# Patient Record
Sex: Male | Born: 1995 | Hispanic: No | Marital: Single | Smoking: Current every day smoker
Health system: Southern US, Community
[De-identification: ages and names within clinical notes are randomized; demographics above are authoritative.]

## PROBLEM LIST (undated history)

## (undated) DIAGNOSIS — R569 Unspecified convulsions: Secondary | ICD-10-CM

## (undated) DIAGNOSIS — H409 Unspecified glaucoma: Secondary | ICD-10-CM

## (undated) HISTORY — DX: Unspecified convulsions: R56.9

## (undated) HISTORY — DX: Unspecified glaucoma: H40.9

---
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
## 2018-10-15 ENCOUNTER — Other Ambulatory Visit
Admission: RE | Admit: 2018-10-15 | Discharge: 2018-10-15 | Disposition: A | Discharge status: Home / Self Care | Payer: Federal, State, Local not specified - PPO | Source: Ambulatory Visit | Attending: Internal Medicine | Admitting: Internal Medicine

## 2018-10-15 DIAGNOSIS — R1084 Generalized abdominal pain: Secondary | ICD-10-CM | POA: Diagnosis present

## 2018-10-15 LAB — COMPREHENSIVE METABOLIC PANEL
ALT: 12 U/L (ref 0–44)
AST: 21 U/L (ref 15–41)
Albumin: 4.4 g/dL (ref 3.5–5.0)
Alkaline Phosphatase: 35 U/L — ABNORMAL LOW (ref 38–126)
Anion gap: 9 (ref 5–15)
BUN: 6 mg/dL (ref 6–20)
CO2: 27 mmol/L (ref 22–32)
Calcium: 9.1 mg/dL (ref 8.9–10.3)
Chloride: 104 mmol/L (ref 98–111)
Creatinine, Ser: 1.07 mg/dL (ref 0.61–1.24)
GFR calc Af Amer: >60 mL/min (ref >60)
GFR calc non Af Amer: >60 mL/min (ref >60)
Glucose, Bld: 102 mg/dL — ABNORMAL HIGH (ref 70–99)
Potassium: 3.3 mmol/L — ABNORMAL LOW (ref 3.5–5.1)
Sodium: 140 mmol/L (ref 135–145)
Total Bilirubin: 0.6 mg/dL (ref 0.3–1.2)
Total Protein: 7.2 g/dL (ref 6.5–8.1)

## 2018-10-15 LAB — LIPASE, BLOOD: Lipase: 36 U/L (ref 11–51)

## 2018-10-15 LAB — AMYLASE: Amylase: 111 U/L — ABNORMAL HIGH (ref 28–100)

## 2019-02-09 ENCOUNTER — Emergency Department: Payer: Federal, State, Local not specified - PPO

## 2019-02-09 ENCOUNTER — Emergency Department
Admission: EM | Admit: 2019-02-09 | Discharge: 2019-02-09 | Disposition: A | Discharge status: Home / Self Care | Payer: Federal, State, Local not specified - PPO | Attending: Emergency Medicine | Admitting: Emergency Medicine

## 2019-02-09 ENCOUNTER — Other Ambulatory Visit: Payer: Self-pay

## 2019-02-09 DIAGNOSIS — R630 Anorexia: Secondary | ICD-10-CM | POA: Diagnosis not present

## 2019-02-09 DIAGNOSIS — R569 Unspecified convulsions: Secondary | ICD-10-CM | POA: Insufficient documentation

## 2019-02-09 DIAGNOSIS — R519 Headache, unspecified: Secondary | ICD-10-CM | POA: Diagnosis not present

## 2019-02-09 LAB — COMPREHENSIVE METABOLIC PANEL
ALT: 16 U/L (ref 0–44)
AST: 24 U/L (ref 15–41)
Albumin: 4.1 g/dL (ref 3.5–5.0)
Alkaline Phosphatase: 33 U/L — ABNORMAL LOW (ref 38–126)
Anion gap: 8 (ref 5–15)
BUN: 9 mg/dL (ref 6–20)
CO2: 23 mmol/L (ref 22–32)
Calcium: 9 mg/dL (ref 8.9–10.3)
Chloride: 105 mmol/L (ref 98–111)
Creatinine, Ser: 0.95 mg/dL (ref 0.61–1.24)
GFR calc Af Amer: >60 mL/min (ref >60)
GFR calc non Af Amer: >60 mL/min (ref >60)
Glucose, Bld: 95 mg/dL (ref 70–99)
Potassium: 3.8 mmol/L (ref 3.5–5.1)
Sodium: 136 mmol/L (ref 135–145)
Total Bilirubin: 0.5 mg/dL (ref 0.3–1.2)
Total Protein: 7.1 g/dL (ref 6.5–8.1)

## 2019-02-09 LAB — URINALYSIS, COMPLETE (UACMP) WITH MICROSCOPIC
Bacteria, UA: NONE SEEN
Bilirubin Urine: NEGATIVE
Glucose, UA: NEGATIVE mg/dL
Hgb urine dipstick: NEGATIVE
Ketones, ur: NEGATIVE mg/dL
Leukocytes,Ua: NEGATIVE
Nitrite: NEGATIVE
Protein, ur: NEGATIVE mg/dL
Specific Gravity, Urine: 1.017 (ref 1.005–1.030)
pH: 6 (ref 5.0–8.0)

## 2019-02-09 LAB — CBC WITH DIFFERENTIAL/PLATELET
Abs Immature Granulocytes: 0.03 10*3/uL (ref 0.00–0.07)
Basophils Absolute: 0 10*3/uL (ref 0.0–0.1)
Basophils Relative: 1 %
Eosinophils Absolute: 0.2 10*3/uL (ref 0.0–0.5)
Eosinophils Relative: 3 %
HCT: 44.9 % (ref 39.0–52.0)
Hemoglobin: 14.8 g/dL (ref 13.0–17.0)
Immature Granulocytes: 1 %
Lymphocytes Relative: 24 %
Lymphs Abs: 1.3 10*3/uL (ref 0.7–4.0)
MCH: 27.1 pg (ref 26.0–34.0)
MCHC: 33 g/dL (ref 30.0–36.0)
MCV: 82.2 fL (ref 80.0–100.0)
Monocytes Absolute: 0.4 10*3/uL (ref 0.1–1.0)
Monocytes Relative: 7 %
Neutro Abs: 3.5 10*3/uL (ref 1.7–7.7)
Neutrophils Relative %: 64 %
Platelets: 190 10*3/uL (ref 150–400)
RBC: 5.46 MIL/uL (ref 4.22–5.81)
RDW: 13.1 % (ref 11.5–15.5)
WBC: 5.5 10*3/uL (ref 4.0–10.5)
nRBC: 0 % (ref 0.0–0.2)

## 2019-02-09 LAB — URINE DRUG SCREEN, QUALITATIVE (ARMC ONLY)
Amphetamines, Ur Screen: NOT DETECTED
Barbiturates, Ur Screen: NOT DETECTED
Benzodiazepine, Ur Scrn: NOT DETECTED
Cannabinoid 50 Ng, Ur ~~LOC~~: NOT DETECTED
Cocaine Metabolite,Ur ~~LOC~~: NOT DETECTED
MDMA (Ecstasy)Ur Screen: NOT DETECTED
Methadone Scn, Ur: NOT DETECTED
Opiate, Ur Screen: NOT DETECTED
Phencyclidine (PCP) Ur S: NOT DETECTED
Tricyclic, Ur Screen: NOT DETECTED

## 2019-02-09 LAB — CK: Total CK: 155 U/L (ref 49–397)

## 2019-02-09 LAB — MAGNESIUM: Magnesium: 2.2 mg/dL (ref 1.7–2.4)

## 2019-02-09 MED ORDER — SODIUM CHLORIDE 0.9 % IV BOLUS
1000.0000 mL | Freq: Once | INTRAVENOUS | Status: AC
  Administered 2019-02-09: 16:00:00 1000 mL via INTRAVENOUS

## 2019-02-09 MED ORDER — LORAZEPAM 2 MG/ML IJ SOLN
1.0000 mg | Freq: Once | INTRAMUSCULAR | Status: AC
  Administered 2019-02-09: 16:00:00 1 mg via INTRAVENOUS
  Filled 2019-02-09: qty 1

## 2019-02-09 NOTE — ED Triage Notes (Signed)
Pt here from home via EMS. Per EMS pt had a witnessed seizure at 1420 that lasted approximately two minutes and was tonic clonic. Pt was post ictal until Ems arrived. Pt now A&O x4. Pt c/o headache 3/10.   Pt denies hx of the same, also denies taking any substances/ alcohol.   Ems vss- bp 110/60, HR 100, RR 16, CBG 109, spO2 97% RA.

## 2019-02-09 NOTE — ED Notes (Signed)
Pt verbalized d/c instructions with no questions.

## 2019-02-09 NOTE — ED Notes (Signed)
EDP at bedside  

## 2019-02-09 NOTE — ED Notes (Signed)
Pt ambulated to bathroom with steady gait. 

## 2019-02-09 NOTE — Discharge Instructions (Addendum)
You have been seen in the emergency department today for a likely seizure.  Your workup today including labs are within normal limits.  Please follow up with your doctor as soon as possible regarding today's emergency department visit and your likely seizure.  You will also need to follow up with a neurologist as soon as possible, please call for appointment.  If you have been prescribed a medication for your seizures, please take this medication as prescribed.  As we have discussed it is very important that you DO NOT drive until you have been seen and cleared by your neurologist.  Please drink plenty of fluids, get plenty of sleep and avoid any alcohol or drug use.  Return to the emergency department if you have any further seizures, develop any weakness/numbness of any arm/leg, confusion, slurred speech, or sudden/severe headache.  

## 2019-02-09 NOTE — ED Provider Notes (Signed)
[facility] Emergency Department Provider Note  ____________________________________________   First MD Initiated Contact with Patient 02/09/19 1541     (approximate)  I have reviewed the triage vital signs and the nursing notes.   HISTORY  Chief Complaint Seizures    HPI Paul Gardner is a 23 y.o. male  With h/o glaucoma here with seizure. Pt reportedly was with his GF earlier today when he had a witnessed GTC seizure. He reportedly twitched toward her then she checked on him, and his eyes were rolled back and his entire body stiffened. This lasted 1-2 min. He then was drowsy and minimally responsive with sonorous respirations. He would not wake up. This lasted 20-30 min until he returned to his baseline. No h/o seizures. Of note, pt has reportedly had decreased appetite and stomach issues for months. He has seen a PCP for this w/o relief. He has had some intermittent HA as well, though not consistent and not worse in AM. No vision changes. No focal numbness or weakness. He has a mild HA now btu o/w is without complaints. He is adamant he does not use drugs or drink alcohol regularly. No other issues.        Past Medical History:  Diagnosis Date  . Glaucoma    left eye    Patient Active Problem List   Diagnosis Date Noted  . 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 SOB (shortness of breath) on exertion 02/04/2015  . Skin lesion 02/04/2015  . Poor concentration 04/02/2014  . Glaucoma 04/02/2014      Prior to Admission medications   Medication Sig Start Date End Date Taking? Authorizing Provider  mycophenolate (CELLCEPT) 500 MG tablet Take 1 Tablet in the morning and 2 tablets in the evening 1 hour before meals or 2 hours after meals. Taken by mouth as directed 01/04/15   [provider]    Allergies Patient has no known allergies.  Family History  Problem Relation Age of Onset  . Mental illness Mother   . Arthritis Maternal Aunt   .  Arthritis Maternal Grandmother   . Alcohol abuse Maternal Grandfather   . Arthritis Paternal Grandmother   . Hypertension Paternal Grandfather   . Hyperlipidemia Paternal Grandfather     Social History Social History   Tobacco Use  . Smoking status: Never Smoker  . Smokeless tobacco: Never Used  Substance Use Topics  . Alcohol use: No    Alcohol/week: 0.0 standard drinks  . Drug use: No    Review of Systems  Review of Systems  Constitutional: Positive for fatigue. Negative for chills and fever.  HENT: Negative for sore throat.   Respiratory: Negative for shortness of breath.   Cardiovascular: Negative for chest pain.  Gastrointestinal: Positive for nausea and vomiting. Negative for abdominal pain.  Genitourinary: Negative for flank pain.  Musculoskeletal: Negative for neck pain.  Skin: Negative for rash and wound.  Allergic/Immunologic: Negative for immunocompromised state.  Neurological: Positive for seizures. Negative for weakness and numbness.  Hematological: Does not bruise/bleed easily.  All other systems reviewed and are negative.    ____________________________________________  PHYSICAL EXAM:      VITAL SIGNS: ED Triage Vitals  Enc Vitals Group     BP      Pulse      Resp      Temp      Temp src      SpO2      Weight      Height  Head Circumference      Peak Flow      Pain Score      Pain Loc      Pain Edu?      Excl. in GC?      Physical Exam Vitals signs and nursing note reviewed.  Constitutional:      General: He is not in acute distress.    Appearance: He is well-developed.  HENT:     Head: Normocephalic and atraumatic.  Eyes:     Conjunctiva/sclera: Conjunctivae normal.  Neck:     Musculoskeletal: Neck supple.  Cardiovascular:     Rate and Rhythm: Normal rate and regular rhythm.     Heart sounds: Normal heart sounds. No murmur. No friction rub.  Pulmonary:     Effort: Pulmonary effort is normal. No respiratory distress.      Breath sounds: Normal breath sounds. No wheezing or rales.  Abdominal:     General: There is no distension.     Palpations: Abdomen is soft.     Tenderness: There is no abdominal tenderness.  Skin:    General: Skin is warm.     Capillary Refill: Capillary refill takes less than 2 seconds.  Neurological:     Mental Status: He is alert and oriented to person, place, and time.     Motor: No abnormal muscle tone.       ____________________________________________   LABS (all labs ordered are listed, but only abnormal results are displayed)  Labs Reviewed  COMPREHENSIVE METABOLIC PANEL - Abnormal; Notable for the following components:      Result Value   Alkaline Phosphatase 33 (*)    All other components within normal limits  URINALYSIS, COMPLETE (UACMP) WITH MICROSCOPIC - Abnormal; Notable for the following components:   Color, Urine YELLOW (*)    APPearance HAZY (*)    All other components within normal limits  CBC WITH DIFFERENTIAL/PLATELET  MAGNESIUM  CK  URINE DRUG SCREEN, QUALITATIVE ([facility] ONLY)    ____________________________________________  EKG: Normal sinus rhythm, VR 77. PR 120, QRS 88, QTc 416. No St elevatiosn or depressions. TWI in lead III noted. No WPW, Brugada, or other arrhythmogenic abnormality noted. ________________________________________  RADIOLOGY All imaging, including plain films, CT scans, and ultrasounds, independently reviewed by me, and interpretations confirmed via formal radiology reads.  ED MD interpretation:   CT Head: NAICA  Official radiology report(s): Ct Head Wo Contrast  Result Date: 02/09/2019 CLINICAL DATA:  New onset seizure.  Headache. EXAM: CT HEAD WITHOUT CONTRAST TECHNIQUE: Contiguous axial images were obtained from the base of the skull through the vertex without intravenous contrast. COMPARISON:  None. FINDINGS: Brain: No evidence of acute infarction, hemorrhage, hydrocephalus, extra-axial collection or mass lesion/mass  effect. Vascular: Negative for hyperdense vessel Skull: Negative Sinuses/Orbits: Negative Other: None IMPRESSION: Negative CT head Electronically Signed   By: Marlan Palauharles  Clark M.D.   On: 02/09/2019 16:24    ____________________________________________  PROCEDURES   Procedure(s) performed (including Critical Care):  Procedures  ____________________________________________  INITIAL IMPRESSION / MDM / ASSESSMENT AND PLAN / ED COURSE  As part of my medical decision making, I reviewed the following data within the electronic MEDICAL RECORD NUMBER Nursing notes reviewed and incorporated, Old chart reviewed, Notes from prior ED visits, and [place] Controlled Substance Database       *Paul Gardner was evaluated in Emergency Department on 02/10/2019 for the symptoms described in the history of present illness. He was evaluated in the context of the global COVID-19 pandemic,  which necessitated consideration that the patient might be at risk for infection with the SARS-CoV-2 virus that causes COVID-19. Institutional protocols and algorithms that pertain to the evaluation of patients at risk for COVID-19 are in a state of rapid change based on information released by regulatory bodies including the CDC and federal and state organizations. These policies and algorithms were followed during the patient's care in the ED.  Some ED evaluations and interventions may be delayed as a result of limited staffing during the pandemic.*  Clinical Course as of Feb 09 237  Mon Feb 09, 2019  1613 23 yo M here with new onset first-time seizure. No focal neurological deficits.   [CI]    Clinical Course User Index [CI] Shaune Pollack, MD    Medical Decision Making:  23 yo M here with new onset seizures. CT head neg. No focal neurological deficits and he is back to baseline. No fever, chills, or infectious sx to suggest meningitis, encephalitis. No focal deficits to suggest CVA, mass/lesion. No personal or family h/o  epilepsy. Denies drug use. Labs reassuring. EKG non-ischemic with no arrhythmogenic abnormality to suggest convulsive syncope from cardiac cause. Given single seizure w return to baseline and reassuring labs and imaging, will refer to Neuro as outpt. Driving precautions discussed.  ____________________________________________  FINAL CLINICAL IMPRESSION(S) / ED DIAGNOSES  Final diagnoses:  First time seizure [facility])     MEDICATIONS GIVEN DURING THIS VISIT:  Medications  sodium chloride 0.9 % bolus 1,000 mL (0 mLs Intravenous Stopped 02/09/19 1739)  LORazepam (ATIVAN) injection 1 mg (1 mg Intravenous Given 02/09/19 1610)     ED Discharge Orders         Ordered    Ambulatory referral to Neurology    Comments: An appointment is requested in approximately: 2 weeks   02/09/19 1723           Note:  This document was prepared using Dragon voice recognition software and may include unintentional dictation errors.   Shaune Pollack, MD 02/10/19 [phone]

## 2019-02-09 NOTE — ED Notes (Signed)
Family at bedside. 

## 2019-02-10 ENCOUNTER — Encounter: Payer: Self-pay | Admitting: Neurology

## 2019-04-14 ENCOUNTER — Encounter: Payer: Self-pay | Admitting: Neurology

## 2019-04-14 ENCOUNTER — Other Ambulatory Visit: Payer: Self-pay

## 2019-04-14 ENCOUNTER — Ambulatory Visit: Payer: Federal, State, Local not specified - PPO | Admitting: Neurology

## 2019-04-14 VITALS — BP 113/61 | HR 63 | Ht 70.0 in | Wt 156.2 lb

## 2019-04-14 DIAGNOSIS — R569 Unspecified convulsions: Secondary | ICD-10-CM | POA: Diagnosis not present

## 2019-04-14 NOTE — Patient Instructions (Addendum)
1. Schedule MRI brain with and without contrast [facility] [phone]  2. Schedule 1-hour sleep-deprived EEG  3. Follow-up in 3 months, call for any changes  Seizure Precautions: 1. If medication has been prescribed for you to prevent seizures, take it exactly as directed.  Do not stop taking the medicine without talking to your doctor first, even if you have not had a seizure in a long time.   2. Avoid activities in which a seizure would cause danger to yourself or to others.  Don't operate dangerous machinery, swim alone, or climb in high or dangerous places, such as on ladders, roofs, or girders.  Do not drive unless your doctor says you may.  3. If you have any warning that you may have a seizure, lay down in a safe place where you can't hurt yourself.    4.  No driving for 6 months from last seizure, as per [facility].   Please refer to the following link on the Epilepsy Foundation of America's website for more information: http://www.epilepsyfoundation.org/answerplace/Social/driving/drivingu.cfm   5.  Maintain good sleep hygiene. Avoid alcohol.  6.  Contact your doctor if you have any problems that may be related to the medicine you are taking.  7.  Call 911 and bring the patient back to the ED if:        A.  The seizure lasts longer than 5 minutes.       B.  The patient doesn't awaken shortly after the seizure  C.  The patient has new problems such as difficulty seeing, speaking or moving  D.  The patient was injured during the seizure  E.  The patient has a temperature over 102 F (39C)  F.  The patient vomited and now is having trouble breathing

## 2019-04-14 NOTE — Progress Notes (Signed)
NEUROLOGY CONSULTATION NOTE  Paul Gardner MRN: [id number] DOB: 09-16-1995  Referring provider: Dr. Duffy Bruce Primary care provider: Dr. Loura Pardon  Reason for consult:  New onset seizure  Dear Dr Ellender Hose:  Thank you for your kind referral of Paul Gardner for consultation of the above symptoms. Although his history is well known to you, please allow me to reiterate it for the purpose of our medical record. The patient was accompanied to the clinic by his father who also provides collateral information. Records and images were personally reviewed where available.  HISTORY OF PRESENT ILLNESS: Paul Gardner is a 24 year old right-handed man with a history of glaucoma, in his usual state of health until he had a nocturnal seizure on 02/09/2019. He works third shift at [employer] and after getting home from work, he recalls eating and feeling dehydrated, then waking up to EMS around him. He was asleep and his girlfriend noticed twitching, eyes rolled back, and entire body stiffened for 1-2 minutes. He was drowsy and minimally responsive when his father came upstairs, with sonorous respirations. This lasted 20-30 minutes until he returned to baseline. No tongue bite, incontinence, focal weakness. It was noted in the ER that he had decreased appetite and stomach issues for months. He also reported intermittent headaches. In the ER, CBC, CMP, UDS were negative. I personally reviewed head CT without contrast which did not show any acute changes. He has been feeling fine since then, with no further seizures. He and his father deny any staring/unresponsive episodes, gaps in time, olfactory/gustatory hallucinations, rising epigastric sensation (except for GERD-like symptoms), focal numbness/tingling/weakness. He has occasional twitching. He denies any significant headaches. He denies any dizziness, diplopia, dysarthria/dysphagia, neck pain, bowel/bladder dysfunction. He has some back pain from work. He denies  any alcohol or drug use. He has had some sleep difficulties due to third shift at work.  Epilepsy Risk Factors:  He had a normal birth and early development.  There is no history of febrile convulsions, CNS infections such as meningitis/encephalitis, significant traumatic brain injury, neurosurgical procedures, or family history of seizures.   PAST MEDICAL HISTORY: Past Medical History:  Diagnosis Date  . Glaucoma    left eye    PAST SURGICAL HISTORY: Past Surgical History:  Procedure Laterality Date  . GONIOTOMY  01/2008    MEDICATIONS: No current outpatient medications on file prior to visit.   No current facility-administered medications on file prior to visit.    ALLERGIES: No Known Allergies  FAMILY HISTORY: Family History  Problem Relation Age of Onset  . Mental illness Mother   . Arthritis Maternal Aunt   . Arthritis Maternal Grandmother   . Alcohol abuse Maternal Grandfather   . Arthritis Paternal Grandmother   . Hypertension Paternal Grandfather   . Hyperlipidemia Paternal Grandfather     SOCIAL HISTORY: Social History   Socioeconomic History  . Marital status: Single    Spouse name: Not on file  . Number of children: Not on file  . Years of education: Not on file  . Highest education level: Not on file  Occupational History  . Not on file  Tobacco Use  . Smoking status: Current Every Day Smoker    Types: E-cigarettes  . Smokeless tobacco: Never Used  Substance and Sexual Activity  . Alcohol use: No    Alcohol/week: 0.0 standard drinks  . Drug use: No  . Sexual activity: Not on file  Other Topics Concern  . Not on file  Social History Narrative  Right handed   Lives with family   2 story home    Social Determinants of Health   Financial Resource Strain:   . Difficulty of Paying Living Expenses: Not on file  Food Insecurity:   . Worried About Programme researcher, broadcasting/film/video in the Last Year: Not on file  . Ran Out of Food in the Last Year: Not on  file  Transportation Needs:   . Lack of Transportation (Medical): Not on file  . Lack of Transportation (Non-Medical): Not on file  Physical Activity:   . Days of Exercise per Week: Not on file  . Minutes of Exercise per Session: Not on file  Stress:   . Feeling of Stress : Not on file  Social Connections:   . Frequency of Communication with Friends and Family: Not on file  . Frequency of Social Gatherings with Friends and Family: Not on file  . Attends Religious Services: Not on file  . Active Member of Clubs or Organizations: Not on file  . Attends Banker Meetings: Not on file  . Marital Status: Not on file  Intimate Partner Violence:   . Fear of Current or Ex-Partner: Not on file  . Emotionally Abused: Not on file  . Physically Abused: Not on file  . Sexually Abused: Not on file    REVIEW OF SYSTEMS: Constitutional: No fevers, chills, or sweats, no generalized fatigue, change in appetite Eyes: No visual changes, double vision, eye pain Ear, nose and throat: No hearing loss, ear pain, nasal congestion, sore throat Cardiovascular: No chest pain, palpitations Respiratory:  No shortness of breath at rest or with exertion, wheezes GastrointestinaI: No nausea, vomiting, diarrhea, abdominal pain, fecal incontinence Genitourinary:  No dysuria, urinary retention or frequency Musculoskeletal:  No neck pain, +back pain Integumentary: No rash, pruritus, skin lesions Neurological: as above Psychiatric: No depression, insomnia, anxiety Endocrine: No palpitations, fatigue, diaphoresis, mood swings, change in appetite, change in weight, increased thirst Hematologic/Lymphatic:  No anemia, purpura, petechiae. Allergic/Immunologic: no itchy/runny eyes, nasal congestion, recent allergic reactions, rashes  PHYSICAL EXAM: Vitals:   04/14/19 0832  BP: 113/61  Pulse: 63  SpO2: 99%   General: No acute distress Head:  Normocephalic/atraumatic Skin/Extremities: No rash, no  edema Neurological Exam: Mental status: alert and oriented to person, place, and time, no dysarthria or aphasia, Fund of knowledge is appropriate.  Recent and remote memory are intact. 3/3 delayed recall. Attention and concentration are normal.    Able to name objects and repeat phrases. Cranial nerves: CN I: not tested CN II: pupils equal, round and reactive to light, visual fields intact CN III, IV, VI:  full range of motion, no nystagmus, no ptosis CN V: facial sensation intact CN VII: upper and lower face symmetric CN VIII: hearing intact to conversation CN IX, X: gag intact, uvula midline CN XI: sternocleidomastoid and trapezius muscles intact CN XII: tongue midline Bulk & Tone: normal, no fasciculations. Motor: 5/5 throughout with no pronator drift. Sensation: intact to light touch, cold, pin, vibration and joint position sense.  No extinction to double simultaneous stimulation.  Romberg test negative Deep Tendon Reflexes: +2 throughout, no ankle clonus Plantar responses: downgoing bilaterally Cerebellar: no incoordination on finger to nose testing Gait: narrow-based and steady, able to tandem walk adequately. Tremor: none  IMPRESSION: This is a 24 year old right-handed man with a history of glaucoma, presenting for evaluation of new onset nocturnal seizure last 02/09/2019. His neurological exam is normal, no clear epilepsy risk factors. We  discussed that after an initial seizure, unless there are significant risk factors, an abnormal neurological exam, an EEG showing epileptiform abnormalities, and/or abnormal neuroimaging, treatment with an antiepileptic drug is not indicated. MRI brain with and without contrast and a 1-hour sleep-deprived EEG will be ordered to assess for focal abnormalities that increase risk for recurrent seizure. We discussed 10% of the population may have a single seizure. Patients with a single unprovoked seizure have a recurrence rate of 33% after a single  seizure and 73% after a second seizure. We discussed [place] driving restrictions which indicate a patient needs to free of seizures or events of altered awareness for 6 months prior to resuming driving. The patient agreed to comply with these restrictions. We discussed avoidance of seizure triggers, including alcohol and sleep deprivation. He works third shift, I discussed working first/second shift, which he states is not possible at this time. We discussed trying melatonin to help with shift work sleep disorder. Follow-up in 3 months, they know to call for any changes.   Thank you for allowing me to participate in the care of this patient. Please do not hesitate to call for any questions or concerns.   Patrcia Dolly, M.D.  CC: Dr. Erma Heritage, Dr. Milinda Antis

## 2019-04-22 ENCOUNTER — Other Ambulatory Visit: Payer: Federal, State, Local not specified - PPO

## 2019-04-30 ENCOUNTER — Ambulatory Visit (INDEPENDENT_AMBULATORY_CARE_PROVIDER_SITE_OTHER): Payer: Federal, State, Local not specified - PPO | Admitting: Neurology

## 2019-04-30 ENCOUNTER — Other Ambulatory Visit: Payer: Self-pay

## 2019-04-30 DIAGNOSIS — R569 Unspecified convulsions: Secondary | ICD-10-CM | POA: Diagnosis not present

## 2019-05-05 NOTE — Procedures (Signed)
ELECTROENCEPHALOGRAM REPORT  Date of Study: 04/30/2019  Patient's Name: Paul Gardner MRN: [id number] Date of Birth: 12-22-1995  Referring Provider: Dr. Patrcia Dolly  Clinical History: This is a 24 year old man with new onset nocturnal seizure in December 2020.   Medications: none  Technical Summary: A multichannel digital 1-hour sleep-deprived EEG recording measured by the international 10-20 system with electrodes applied with paste and impedances below 5000 ohms performed in our laboratory with EKG monitoring in an awake and asleep patient.  Hyperventilation was not performed. Photic stimulation was performed.  The digital EEG was referentially recorded, reformatted, and digitally filtered in a variety of bipolar and referential montages for optimal display.    Description: The patient is awake and asleep during the recording.  During maximal wakefulness, there is a symmetric, medium voltage 10 Hz posterior dominant rhythm that attenuates with eye opening.  The record is symmetric.  During drowsiness and sleep, there is an increase in theta slowing of the background, with shifting asymmetry over the bilateral temporal regions.  Vertex waves and symmetric sleep spindles were seen.  Hyperventilation and photic stimulation did not elicit any abnormalities.  There were no epileptiform discharges or electrographic seizures seen.    EKG lead was unremarkable.  Impression: This 1-hour awake and asleep EEG is within normal limits.  Clinical Correlation: A normal EEG does not exclude a clinical diagnosis of epilepsy.  If further clinical questions remain, prolonged EEG may be helpful.  Clinical correlation is advised.   Patrcia Dolly, M.D.

## 2019-05-14 ENCOUNTER — Ambulatory Visit
Admission: RE | Admit: 2019-05-14 | Discharge: 2019-05-14 | Disposition: A | Discharge status: Home / Self Care | Payer: Federal, State, Local not specified - PPO | Source: Ambulatory Visit | Attending: Neurology | Admitting: Neurology

## 2019-05-14 ENCOUNTER — Other Ambulatory Visit: Payer: Self-pay

## 2019-05-14 DIAGNOSIS — R569 Unspecified convulsions: Secondary | ICD-10-CM

## 2019-05-14 MED ORDER — GADOBENATE DIMEGLUMINE 529 MG/ML IV SOLN
14.0000 mL | Freq: Once | INTRAVENOUS | Status: AC | PRN
  Administered 2019-05-14: 11:00:00 14 mL via INTRAVENOUS

## 2019-05-20 ENCOUNTER — Telehealth: Payer: Self-pay | Admitting: Neurology

## 2019-05-20 NOTE — Telephone Encounter (Signed)
Pls let father know the MRI brain was normal, no evidence of tumor, stroke, or bleed. The EEG was normal. It is not like a pregnancy test that is positive or negative, it is just a snapshot of his brain waves. As we discussed, with normal tests after 1 seizure, we don't usually start a seizure medication. Some people can have one seizure and nothing else, however the reason the DMV gave the 6 month driving restriction is that usually if someone will have another seizure, it is within the first 6 months, and if he does have another seizure, we will need to start medication at that point. Call for any changes.

## 2019-05-20 NOTE — Telephone Encounter (Signed)
Patient's father called and requested this patient's recent MRI and EEG results.

## 2019-05-20 NOTE — Telephone Encounter (Signed)
Spoke with pt informed him that    MRI brain was normal, no evidence of tumor, stroke, or bleed. The EEG was normal. It is not like a pregnancy test that is positive or negative, it is just a snapshot of his brain waves. As we discussed, with normal tests after 1 seizure, we don't usually start a seizure medication. Some people can have one seizure and nothing else, however the reason the DMV gave the 6 month driving restriction is that usually if someone will have another seizure, it is within the first 6 months, and if he does have another seizure, we will need to start medication at that point. Call for any changes.

## 2019-07-24 ENCOUNTER — Encounter: Payer: Self-pay | Admitting: Neurology

## 2019-07-24 ENCOUNTER — Ambulatory Visit: Payer: Federal, State, Local not specified - PPO | Admitting: Neurology

## 2019-07-24 ENCOUNTER — Other Ambulatory Visit: Payer: Self-pay

## 2019-07-24 VITALS — BP 112/70 | HR 86 | Resp 20 | Ht 70.0 in | Wt 146.0 lb

## 2019-07-24 DIAGNOSIS — R569 Unspecified convulsions: Secondary | ICD-10-CM | POA: Diagnosis not present

## 2019-07-24 NOTE — Patient Instructions (Signed)
Great to see you. Follow-up in 6 months, call for any changes.  Seizure Precautions: 1. If medication has been prescribed for you to prevent seizures, take it exactly as directed.  Do not stop taking the medicine without talking to your doctor first, even if you have not had a seizure in a long time.   2. Avoid activities in which a seizure would cause danger to yourself or to others.  Don't operate dangerous machinery, swim alone, or climb in high or dangerous places, such as on ladders, roofs, or girders.  Do not drive unless your doctor says you may.  3. If you have any warning that you may have a seizure, lay down in a safe place where you can't hurt yourself.    4.  No driving for 6 months from last seizure, as per [facility].   Please refer to the following link on the Epilepsy Foundation of [employer]'s website for more information: http://www.epilepsyfoundation.org/answerplace/Social/driving/drivingu.cfm   5.  Maintain good sleep hygiene. Avoid alcohol.  6.  Contact your doctor if you have any problems that may be related to the medicine you are taking.  7.  Call 911 and bring the patient back to the ED if:        A.  The seizure lasts longer than 5 minutes.       B.  The patient doesn't awaken shortly after the seizure  C.  The patient has new problems such as difficulty seeing, speaking or moving  D.  The patient was injured during the seizure  E.  The patient has a temperature over 102 F (39C)  F.  The patient vomited and now is having trouble breathing

## 2019-07-24 NOTE — Progress Notes (Signed)
NEUROLOGY FOLLOW UP OFFICE NOTE  Gerber Penza [id number] 1996-03-04  HISTORY OF PRESENT ILLNESS: I had the pleasure of seeing Paul Gardner in follow-up in the neurology clinic on 07/24/2019.  The patient was last seen 3 months ago for new onset nocturnal seizure that occurred last 02/09/2019. He is again accompanied by his father who helps supplement the history today. He and his father deny any seizures or seizure-like symptoms since 02/2019. Records and images were personally reviewed where available.  I personally reviewed MRI brain with and without contrast which was normal, hippocampi symmetric with no abnormal signal or enhancement seen. His 1-hour wake and sleep EEG was within normal limits. He denies any staring/unresponsive episodes, gaps in time, olfactory/gustatory hallucinations, focal numbness/tingling/weakness, myoclonic jerks. No significant headaches, dizziness, no falls. Sleep is good.   History on Initial Assessment 04/14/2019: Paul Gardner is a 24 year old right-handed man with a history of glaucoma, in his usual state of health until he had a nocturnal seizure on 02/09/2019. He works third shift at [employer] and after getting home from work, he recalls eating and feeling dehydrated, then waking up to EMS around him. He was asleep and his girlfriend noticed twitching, eyes rolled back, and entire body stiffened for 1-2 minutes. He was drowsy and minimally responsive when his father came upstairs, with sonorous respirations. This lasted 20-30 minutes until he returned to baseline. No tongue bite, incontinence, focal weakness. It was noted in the ER that he had decreased appetite and stomach issues for months. He also reported intermittent headaches. In the ER, CBC, CMP, UDS were negative. I personally reviewed head CT without contrast which did not show any acute changes. He has been feeling fine since then, with no further seizures. He and his father deny any staring/unresponsive episodes,  gaps in time, olfactory/gustatory hallucinations, rising epigastric sensation (except for GERD-like symptoms), focal numbness/tingling/weakness. He has occasional twitching. He denies any significant headaches. He denies any dizziness, diplopia, dysarthria/dysphagia, neck pain, bowel/bladder dysfunction. He has some back pain from work. He denies any alcohol or drug use. He has had some sleep difficulties due to third shift at work.  Epilepsy Risk Factors:  He had a normal birth and early development.  There is no history of febrile convulsions, CNS infections such as meningitis/encephalitis, significant traumatic brain injury, neurosurgical procedures, or family history of seizures.   PAST MEDICAL HISTORY: Past Medical History:  Diagnosis Date  . Glaucoma    left eye    MEDICATIONS: No current outpatient medications on file prior to visit.   No current facility-administered medications on file prior to visit.    ALLERGIES: No Known Allergies  FAMILY HISTORY: Family History  Problem Relation Age of Onset  . Mental illness Mother   . Arthritis Maternal Aunt   . Arthritis Maternal Grandmother   . Alcohol abuse Maternal Grandfather   . Arthritis Paternal Grandmother   . Hypertension Paternal Grandfather   . Hyperlipidemia Paternal Grandfather     SOCIAL HISTORY: Social History   Socioeconomic History  . Marital status: Single    Spouse name: Not on file  . Number of children: Not on file  . Years of education: Not on file  . Highest education level: Not on file  Occupational History  . Not on file  Tobacco Use  . Smoking status: Current Every Day Smoker    Types: E-cigarettes  . Smokeless tobacco: Never Used  Substance and Sexual Activity  . Alcohol use: No    Alcohol/week: 0.0  standard drinks  . Drug use: No  . Sexual activity: Not on file  Other Topics Concern  . Not on file  Social History Narrative   Right handed   Lives with family   2 story home     Social Determinants of Health   Financial Resource Strain:   . Difficulty of Paying Living Expenses:   Food Insecurity:   . Worried About Programme researcher, broadcasting/film/video in the Last Year:   . Barista in the Last Year:   Transportation Needs:   . Freight forwarder (Medical):   Marland Kitchen Lack of Transportation (Non-Medical):   Physical Activity:   . Days of Exercise per Week:   . Minutes of Exercise per Session:   Stress:   . Feeling of Stress :   Social Connections:   . Frequency of Communication with Friends and Family:   . Frequency of Social Gatherings with Friends and Family:   . Attends Religious Services:   . Active Member of Clubs or Organizations:   . Attends Banker Meetings:   Marland Kitchen Marital Status:   Intimate Partner Violence:   . Fear of Current or Ex-Partner:   . Emotionally Abused:   Marland Kitchen Physically Abused:   . Sexually Abused:     REVIEW OF SYSTEMS: Constitutional: No fevers, chills, or sweats, no generalized fatigue, change in appetite Eyes: No visual changes, double vision, eye pain Ear, nose and throat: No hearing loss, ear pain, nasal congestion, sore throat Cardiovascular: No chest pain, palpitations Respiratory:  No shortness of breath at rest or with exertion, wheezes GastrointestinaI: No nausea, vomiting, diarrhea, abdominal pain, fecal incontinence Genitourinary:  No dysuria, urinary retention or frequency Musculoskeletal:  No neck pain, back pain Integumentary: No rash, pruritus, skin lesions Neurological: as above Psychiatric: No depression, insomnia, anxiety Endocrine: No palpitations, fatigue, diaphoresis, mood swings, change in appetite, change in weight, increased thirst Hematologic/Lymphatic:  No anemia, purpura, petechiae. Allergic/Immunologic: no itchy/runny eyes, nasal congestion, recent allergic reactions, rashes  PHYSICAL EXAM: Vitals:   07/24/19 1409  BP: 112/70  Pulse: 86  Resp: 20  SpO2: 99%   General: No acute  distress Head:  Normocephalic/atraumatic Skin/Extremities: No rash, no edema Neurological Exam: alert and oriented to person, place, and time. No aphasia or dysarthria. Fund of knowledge is appropriate.  Recent and remote memory are intact.  Attention and concentration are normal.   Cranial nerves: Pupils equal, round, reactive to light.Extraocular movements intact with no nystagmus. Visual fields full.  No facial asymmetry.  Motor: Bulk and tone normal, muscle strength 5/5 throughout with no pronator drift.   Finger to nose testing intact.  Gait narrow-based and steady, able to tandem walk adequately.    IMPRESSION: This is a 24 yo RH man with a history of glaucoma, with new onset nocturnal seizure last 02/09/2019. Neurological exam normal, MRI and EEG normal. We again discussed that after an initial seizure, unless there are significant risk factors, an abnormal neurological exam, an EEG showing epileptiform abnormalities, and/or abnormal neuroimaging, treatment with an antiepileptic drug is not indicated. He and his father deny any seizures or seizure-like symptoms since 02/2019. We discussed avoidance of seizure triggers. We again discussed [place] driving laws to stop driving until 6 months seizure-free. The patient was instructed to call with recurrent events, as in that case treatment and further diagnostic workup may be indicated. Follow-up in 6 months.  Thank you for allowing me to participate in his care.  Please do not  hesitate to call for any questions or concerns.   Patrcia Dolly, M.D.   CC: Dr. Milinda Antis

## 2019-11-01 ENCOUNTER — Emergency Department
Admission: EM | Admit: 2019-11-01 | Discharge: 2019-11-02 | Disposition: A | Discharge status: Home / Self Care | Payer: Federal, State, Local not specified - PPO | Attending: Emergency Medicine | Admitting: Emergency Medicine

## 2019-11-01 ENCOUNTER — Other Ambulatory Visit: Payer: Self-pay

## 2019-11-01 ENCOUNTER — Emergency Department: Payer: Federal, State, Local not specified - PPO

## 2019-11-01 DIAGNOSIS — F17210 Nicotine dependence, cigarettes, uncomplicated: Secondary | ICD-10-CM | POA: Diagnosis not present

## 2019-11-01 DIAGNOSIS — R569 Unspecified convulsions: Secondary | ICD-10-CM | POA: Insufficient documentation

## 2019-11-01 DIAGNOSIS — Z20822 Contact with and (suspected) exposure to covid-19: Secondary | ICD-10-CM | POA: Diagnosis not present

## 2019-11-01 LAB — COMPREHENSIVE METABOLIC PANEL
ALT: 13 U/L (ref 0–44)
AST: 28 U/L (ref 15–41)
Albumin: 4.9 g/dL (ref 3.5–5.0)
Alkaline Phosphatase: 33 U/L — ABNORMAL LOW (ref 38–126)
Anion gap: 12 (ref 5–15)
BUN: 12 mg/dL (ref 6–20)
CO2: 24 mmol/L (ref 22–32)
Calcium: 9.1 mg/dL (ref 8.9–10.3)
Chloride: 100 mmol/L (ref 98–111)
Creatinine, Ser: 1.22 mg/dL (ref 0.61–1.24)
GFR calc Af Amer: >60 mL/min (ref >60)
GFR calc non Af Amer: >60 mL/min (ref >60)
Glucose, Bld: 83 mg/dL (ref 70–99)
Potassium: 4.3 mmol/L (ref 3.5–5.1)
Sodium: 136 mmol/L (ref 135–145)
Total Bilirubin: 1 mg/dL (ref 0.3–1.2)
Total Protein: 8 g/dL (ref 6.5–8.1)

## 2019-11-01 LAB — CBC
HCT: 45.9 % (ref 39.0–52.0)
Hemoglobin: 15.4 g/dL (ref 13.0–17.0)
MCH: 27.6 pg (ref 26.0–34.0)
MCHC: 33.6 g/dL (ref 30.0–36.0)
MCV: 82.4 fL (ref 80.0–100.0)
Platelets: 211 10*3/uL (ref 150–400)
RBC: 5.57 MIL/uL (ref 4.22–5.81)
RDW: 12.3 % (ref 11.5–15.5)
WBC: 10.6 10*3/uL — ABNORMAL HIGH (ref 4.0–10.5)
nRBC: 0 % (ref 0.0–0.2)

## 2019-11-01 MED ORDER — SODIUM CHLORIDE 0.9 % IV BOLUS: 1000.0000 mL | Freq: Once | INTRAVENOUS | Status: DC

## 2019-11-01 MED ORDER — LEVETIRACETAM 500 MG PO TABS
500.0000 mg | ORAL_TABLET | Freq: Once | ORAL | Status: AC
  Administered 2019-11-01: 500 mg via ORAL
  Filled 2019-11-01: qty 1

## 2019-11-01 MED ORDER — LEVETIRACETAM 500 MG PO TABS: 500.0000 mg | ORAL_TABLET | Freq: Two times a day (BID) | ORAL | 0 refills | Status: DC

## 2019-11-01 NOTE — ED Provider Notes (Signed)
[facility] Emergency Department Provider Note   ____________________________________________   First MD Initiated Contact with Patient 11/01/19 2302     (approximate)  I have reviewed the triage vital signs and the nursing notes.   HISTORY  Chief Complaint Seizures    HPI Paul Gardner is a 24 y.o. male brought to the ED by his father with a chief complaint of seizures.  Patient has a history of one-time seizure in December 2020.  Is followed by [facility] neurology.  Had normal MRI and EEG previously with last neurology visit in May/2021.  Patient reports he had a seizure when he woke up today which was witnessed by his girlfriend. She described it as tonic-clonic lasting a few minutes.  Patient then had another seizure around noon.  Did not bite tongue or have urinary incontinence.  Has had some stressors surrounding finances and having to put his dog to sleep.  Denies recent illnesses such as fever, cough, chest pain, shortness of breath, abdominal pain, nausea, vomiting, headache or dizziness.  Denies recent trauma.       Past Medical History:  Diagnosis Date  . Glaucoma    left eye    Patient Active Problem List   Diagnosis Date Noted  . 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 SOB (shortness of breath) on exertion 02/04/2015  . Skin lesion 02/04/2015  . Poor concentration 04/02/2014  . Glaucoma 04/02/2014    Past Surgical History:  Procedure Laterality Date  . GONIOTOMY  01/2008    Prior to Admission medications   Medication Sig Start Date End Date Taking? Authorizing Provider  levETIRAcetam (KEPPRA) 500 MG tablet Take 1 tablet (500 mg total) by mouth 2 (two) times daily. 11/01/19   Irean Hong, MD    Allergies Patient has no known allergies.  Family History  Problem Relation Age of Onset  . Mental illness Mother   . Arthritis Maternal Aunt   . Arthritis Maternal Grandmother   . Alcohol abuse Maternal Grandfather   . Arthritis  Paternal Grandmother   . Hypertension Paternal Grandfather   . Hyperlipidemia Paternal Grandfather     Social History Social History   Tobacco Use  . Smoking status: Current Every Day Smoker    Types: E-cigarettes  . Smokeless tobacco: Never Used  Substance Use Topics  . Alcohol use: No    Alcohol/week: 0.0 standard drinks  . Drug use: No    Review of Systems  Constitutional: No fever/chills Eyes: No visual changes. ENT: No sore throat. Cardiovascular: Denies chest pain. Respiratory: Denies shortness of breath. Gastrointestinal: No abdominal pain.  No nausea, no vomiting.  No diarrhea.  No constipation. Genitourinary: Negative for dysuria. Musculoskeletal: Negative for back pain. Skin: Negative for rash. Neurological: Positive for seizure x2.  Negative for headaches, focal weakness or numbness.   ____________________________________________   PHYSICAL EXAM:  VITAL SIGNS: ED Triage Vitals  Enc Vitals Group     BP 11/01/19 1341 113/72     Pulse Rate 11/01/19 1341 62     Resp 11/01/19 1341 16     Temp 11/01/19 1341 98.8 F (37.1 C)     Temp Source 11/01/19 1341 Oral     SpO2 11/01/19 1341 98 %     Weight 11/01/19 1342 140 lb (63.5 kg)     Height 11/01/19 1342 5\' 11"  (1.803 m)     Head Circumference --      Peak Flow --      Pain Score 11/01/19 1342 3  Pain Loc --      Pain Edu? --      Excl. in GC? --     Constitutional: Alert and oriented. Well appearing and in no acute distress. Eyes: Conjunctivae are normal. PERRL. EOMI. Head: Atraumatic. Nose: Atraumatic. Mouth/Throat: Mucous membranes are moist.  No dental malocclusion.  Did not bite tongue.   Neck: No stridor.  No cervical spine tenderness to palpation. Cardiovascular: Normal rate, regular rhythm. Grossly normal heart sounds.  Good peripheral circulation. Respiratory: Normal respiratory effort.  No retractions. Lungs CTAB. Gastrointestinal: Soft and nontender. No distention. No abdominal bruits.  No CVA tenderness. Musculoskeletal: No lower extremity tenderness nor edema.  No joint effusions. Neurologic: Alert and oriented x3.  CN II to XII grossly intact.  Normal speech and language. No gross focal neurologic deficits are appreciated. MAEx4.  No gait instability. Skin:  Skin is warm, dry and intact. No rash noted. Psychiatric: Mood and affect are normal. Speech and behavior are normal.  ____________________________________________   LABS (all labs ordered are listed, but only abnormal results are displayed)  Labs Reviewed  CBC - Abnormal; Notable for the following components:      Result Value   WBC 10.6 (*)    All other components within normal limits  COMPREHENSIVE METABOLIC PANEL - Abnormal; Notable for the following components:   Alkaline Phosphatase 33 (*)    All other components within normal limits  SARS CORONAVIRUS 2 BY RT PCR (HOSPITAL ORDER, PERFORMED IN [facility])   ____________________________________________  EKG  ED ECG REPORT I, Azoria Abbett J, the attending physician, personally viewed and interpreted this ECG.   Date: 11/01/2019  EKG Time: 1336  Rate: 73  Rhythm: normal EKG, normal sinus rhythm  Axis: Normal  Intervals:none  ST&T Change: Nonspecific  ____________________________________________  RADIOLOGY  ED MD interpretation: Normal CT head  Official radiology report(s): CT Head Wo Contrast  Result Date: 11/01/2019 CLINICAL DATA:  Seizure. EXAM: CT HEAD WITHOUT CONTRAST TECHNIQUE: Contiguous axial images were obtained from the base of the skull through the vertex without intravenous contrast. COMPARISON:  February 09, 2019 FINDINGS: Brain: No evidence of acute infarction, hemorrhage, hydrocephalus, extra-axial collection or mass lesion/mass effect. Vascular: No hyperdense vessel or unexpected calcification. Skull: Normal. Negative for fracture or focal lesion. Sinuses/Orbits: No acute finding. Other: None. IMPRESSION: No acute  intracranial pathology. Electronically Signed   By: Aram Candela M.D.   On: 11/01/2019 23:26    ____________________________________________   PROCEDURES  Procedure(s) performed (including Critical Care):  Procedures   ____________________________________________   INITIAL IMPRESSION / ASSESSMENT AND PLAN / ED COURSE  As part of my medical decision making, I reviewed the following data within the electronic MEDICAL RECORD NUMBER History obtained from family, Nursing notes reviewed and incorporated, Labs reviewed, Old chart reviewed, Radiograph reviewed and Notes from prior ED visits     Paul Gardner was evaluated in Emergency Department on 11/01/2019 for the symptoms described in the history of present illness. He was evaluated in the context of the global COVID-19 pandemic, which necessitated consideration that the patient might be at risk for infection with the SARS-CoV-2 virus that causes COVID-19. Institutional protocols and algorithms that pertain to the evaluation of patients at risk for COVID-19 are in a state of rapid change based on information released by regulatory bodies including the CDC and federal and state organizations. These policies and algorithms were followed during the patient's care in the ED.    24 year old male presenting with seizures.  Differential  diagnosis includes but is not limited to ICH, infectious, metabolic etiologies, etc.  Patient is neurologically intact; CT head and lab work normal.  I did offer the patient hospitalization given that he had multiple seizures today and his last seizure was in December 2020.  However, both patient and father desire to go home and follow-up with his neurologist.  Will start Keppra 500 mg bid.  Will obtain Covid swab.  Driving restrictions given.  Strict return precautions given.  Both verbalized understanding agree with plan of care.  3382 Addendum on chart review: Covid negative      ____________________________________________   FINAL CLINICAL IMPRESSION(S) / ED DIAGNOSES  Final diagnoses:  Seizure [facility])     ED Discharge Orders         Ordered    levETIRAcetam (KEPPRA) 500 MG tablet  2 times daily        11/01/19 2332           Note:  This document was prepared using Dragon voice recognition software and may include unintentional dictation errors.   Irean Hong, MD 11/02/19 [phone]

## 2019-11-01 NOTE — ED Notes (Signed)
Pt is ambulatory to the room with his father in no apparent distress.  Hx of seizures, on no medication at this time.  Sees Dr. Karel Jarvis in [place].  Per father, seizure this morning at 7:30am and another at 1230pm, states he has never had 2 in one day.

## 2019-11-01 NOTE — ED Triage Notes (Signed)
Pt states he had 2 seizures this morning after he woke up- pt states he has had 1 seizure prior to this about 6 months ago and is not on medications for seizures- pt states he does not have an aura before seizing- pt states he has been to seen a neurologist after his first seizure

## 2019-11-01 NOTE — Discharge Instructions (Addendum)
Start Keppra 500 mg twice daily for seizures.  Do not drive or operate heavy machinery until cleared by your neurologist.  Return to the ER for recurrent or worsening symptoms, persistent vomiting, lethargy or other concerns.

## 2019-11-01 NOTE — ED Notes (Signed)
Pt seen ambulating from outside to inside lobby with family without difficulty.

## 2019-11-02 ENCOUNTER — Telehealth: Payer: Self-pay | Admitting: Neurology

## 2019-11-02 LAB — SARS CORONAVIRUS 2 BY RT PCR (HOSPITAL ORDER, PERFORMED IN ~~LOC~~ HOSPITAL LAB): SARS Coronavirus 2: NEGATIVE

## 2019-11-02 NOTE — Telephone Encounter (Signed)
Patient's father called and left a message wanting to check in with a nurse.

## 2019-11-02 NOTE — Telephone Encounter (Signed)
Ok to put on 8/31 at 1:30pm, thanks

## 2019-11-02 NOTE — Telephone Encounter (Signed)
The following message was left with [employer] on 11/02/19 at 7:36 AM.  Caller states his son had two seizures yesterday and he would like to speak with a nurse to see if the patient needs to be seen sooner than December.

## 2019-11-03 ENCOUNTER — Other Ambulatory Visit: Payer: Self-pay

## 2019-11-03 ENCOUNTER — Ambulatory Visit: Payer: Federal, State, Local not specified - PPO | Admitting: Neurology

## 2019-11-03 ENCOUNTER — Encounter: Payer: Self-pay | Admitting: Neurology

## 2019-11-03 VITALS — BP 115/73 | HR 50 | Ht 71.0 in | Wt 150.0 lb

## 2019-11-03 DIAGNOSIS — G40909 Epilepsy, unspecified, not intractable, without status epilepticus: Secondary | ICD-10-CM | POA: Diagnosis not present

## 2019-11-03 DIAGNOSIS — R569 Unspecified convulsions: Secondary | ICD-10-CM

## 2019-11-03 MED ORDER — LEVETIRACETAM 500 MG PO TABS
500.0000 mg | ORAL_TABLET | Freq: Two times a day (BID) | ORAL | 11 refills | Status: DC
Start: 2019-11-03 — End: 2019-11-06

## 2019-11-03 NOTE — Patient Instructions (Addendum)
1. Continue Keppra (levetiracetam) 500mg  twice a day  2. Schedule 24-hour EEG  3. Check Keppra level next week  4. Follow-up as scheduled in December, call for any changes   Seizure Precautions: 1. If medication has been prescribed for you to prevent seizures, take it exactly as directed.  Do not stop taking the medicine without talking to your doctor first, even if you have not had a seizure in a long time.   2. Avoid activities in which a seizure would cause danger to yourself or to others.  Don't operate dangerous machinery, swim alone, or climb in high or dangerous places, such as on ladders, roofs, or girders.  Do not drive unless your doctor says you may.  3. If you have any warning that you may have a seizure, lay down in a safe place where you can't hurt yourself.    4.  No driving for 6 months from last seizure, as per [facility].   Please refer to the following link on the Epilepsy Foundation of America's website for more information: http://www.epilepsyfoundation.org/answerplace/Social/driving/drivingu.cfm   5.  Maintain good sleep hygiene. Avoid alcohol.  6.  Contact your doctor if you have any problems that may be related to the medicine you are taking.  7.  Call 911 and bring the patient back to the ED if:        A.  The seizure lasts longer than 5 minutes.       B.  The patient doesn't awaken shortly after the seizure  C.  The patient has new problems such as difficulty seeing, speaking or moving  D.  The patient was injured during the seizure  E.  The patient has a temperature over 102 F (39C)  F.  The patient vomited and now is having trouble breathing

## 2019-11-03 NOTE — Progress Notes (Signed)
NEUROLOGY FOLLOW UP OFFICE NOTE  Paul Gardner [id number] 09-May-1995  HISTORY OF PRESENT ILLNESS: I had the pleasure of seeing Paul Gardner in follow-up in the neurology clinic on 11/03/2019.  The patient was last seen 3 months ago for new onset nocturnal seizure in December 2020. He is again accompanied by his father who helps supplement the history today.  Records and images were personally reviewed where available.  MRI brain and EEG were normal. He presents as an urgent follow-up after having 2  seizures in his sleep on 11/01/2019. 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 No tongue bite or incontinence. He was still out of it and went downstairs, family was going to have their dog put down that morning. He went back up then went to sleep and had another convulsion. He was brought to the ER where CBC, CMP were unremarkable, head CT no acute changes. He was discharged home on Levetiracetam 500mg  BID. He denies any side effects. He denies any staring/unresponsive episodes, focal numbness/tingling/weakness, olfactory/gustatory hallucinations, myoclonic jerks. No headaches, dizziness, vision changes, no falls.    History on Initial Assessment 04/14/2019: Paul Gardner is a 24 year old right-handed man with a history of glaucoma, in his usual state of health until he had a nocturnal seizure on 02/09/2019. He works third shift at [employer]/09[employer] and after getting home from work, he recalls eating and feeling dehydrated, then waking up to EMS around him. He was asleep and his girlfriend noticed twitching, eyes rolled back, and entire body stiffened for 1-2 minutes. He was drowsy and minimally responsive when his father came upstairs, with sonorous respirations. This lasted 20-30 minutes until he returned to baseline. No tongue bite, incontinence, focal weakness. It was noted in the ER that he had decreased appetite and stomach issues for  months. He also reported intermittent headaches. In the ER, CBC, CMP, UDS were negative. I personally reviewed head CT without contrast which did not show any acute changes. He has been feeling fine since then, with no further seizures. He and his father deny any staring/unresponsive episodes, gaps in time, olfactory/gustatory hallucinations, rising epigastric sensation (except for GERD-like symptoms), focal numbness/tingling/weakness. He has occasional twitching. He denies any significant headaches. He denies any dizziness, diplopia, dysarthria/dysphagia, neck pain, bowel/bladder dysfunction. He has some back pain from work. He denies any alcohol or drug use. He has had some sleep difficulties due to third shift at work.  Epilepsy Risk Factors:  He had a normal birth and early development.  There is no history of febrile convulsions, CNS infections such as meningitis/encephalitis, significant traumatic brain injury, neurosurgical procedures, or family history of seizures.  PAST MEDICAL HISTORY: Past Medical History:  Diagnosis Date  . Glaucoma    left eye  . Seizures (HCC)     MEDICATIONS: Current Outpatient Medications on File Prior to Visit  Medication Sig Dispense Refill  . levETIRAcetam (KEPPRA) 500 MG tablet Take 1 tablet (500 mg total) by mouth 2 (two) times daily. 60 tablet 0   No current facility-administered medications on file prior to visit.    ALLERGIES: No Known Allergies  FAMILY HISTORY: Family History  Problem Relation Age of Onset  . Mental illness Mother   . Arthritis Maternal Aunt   . Arthritis Maternal Grandmother   . Alcohol abuse Maternal Grandfather   . Arthritis Paternal Grandmother   . Hypertension Paternal Grandfather   . Hyperlipidemia Paternal Grandfather  SOCIAL HISTORY: Social History   Socioeconomic History  . Marital status: Single    Spouse name: Not on file  . Number of children: Not on file  . Years of education: Not on file  . Highest  education level: Not on file  Occupational History  . Not on file  Tobacco Use  . Smoking status: Current Every Day Smoker    Types: E-cigarettes  . Smokeless tobacco: Never Used  Vaping Use  . Vaping Use: Former  Substance and Sexual Activity  . Alcohol use: No    Alcohol/week: 0.0 standard drinks  . Drug use: Yes    Types: Marijuana  . Sexual activity: Not on file  Other Topics Concern  . Not on file  Social History Narrative   Right handed   Lives with family   2 story home    Drinks no caffeine - Drinks sweet tea occasional    Social Determinants of Health   Financial Resource Strain:   . Difficulty of Paying Living Expenses: Not on file  Food Insecurity:   . Worried About Programme researcher, broadcasting/film/video in the Last Year: Not on file  . Ran Out of Food in the Last Year: Not on file  Transportation Needs:   . Lack of Transportation (Medical): Not on file  . Lack of Transportation (Non-Medical): Not on file  Physical Activity:   . Days of Exercise per Week: Not on file  . Minutes of Exercise per Session: Not on file  Stress:   . Feeling of Stress : Not on file  Social Connections:   . Frequency of Communication with Friends and Family: Not on file  . Frequency of Social Gatherings with Friends and Family: Not on file  . Attends Religious Services: Not on file  . Active Member of Clubs or Organizations: Not on file  . Attends Banker Meetings: Not on file  . Marital Status: Not on file  Intimate Partner Violence:   . Fear of Current or Ex-Partner: Not on file  . Emotionally Abused: Not on file  . Physically Abused: Not on file  . Sexually Abused: Not on file     PHYSICAL EXAM: Vitals:   11/03/19 1516  BP: 115/73  Pulse: (!) 50  SpO2: 100%   General: No acute distress Head:  Normocephalic/atraumatic Skin/Extremities: No rash, no edema Neurological Exam: alert and oriented to person, place, and time. No aphasia or dysarthria. Fund of knowledge is  appropriate.  Recent and remote memory are intact.  Attention and concentration are normal.  Cranial nerves: Pupils equal, round. Extraocular movements intact with no nystagmus. Visual fields full. No facial asymmetry.   Motor: Bulk and tone normal, muscle strength 5/5 throughout with no pronator drift.  Finger to nose testing intact.  Gait narrow-based and steady, able to tandem walk adequately.    IMPRESSION: This is a 24 yo RH man with a history of glaucoma, with new onset nocturnal seizure last 02/09/2019. Neurological exam normal, MRI and EEG normal. He presents today after having 2 more nocturnal seizures on 11/01/2019 in the setting of sleep deprivation and stress. We discussed the diagnosis of epilepsy at this point, since he now has had recurrent unprovoked seizures. Etiology unclear, a 24-hour EEG will be ordered for seizure classification. We discussed the need for continued AED moving forward, refills sent for Levetiracetam 500mg  BID. We discussed avoidance of seizure triggers, including missing medications, alcohol, and sleep deprivation. We discussed [place] driving laws to stop driving  until 6 months seizure-free, however since all of his seizure have been nocturnal, he will request for an exemption through the [facility], paperwork will be filled out. Check Keppra level. Follow-up as scheduled in December 2021, they know to call for any changes.    Thank you for allowing me to participate in his care.  Please do not hesitate to call for any questions or concerns.   Patrcia Dolly, M.D.   CC: Dr. Milinda Antis

## 2019-11-06 ENCOUNTER — Other Ambulatory Visit: Payer: Self-pay | Admitting: Neurology

## 2019-11-06 MED ORDER — LEVETIRACETAM 500 MG PO TABS
500.0000 mg | ORAL_TABLET | Freq: Two times a day (BID) | ORAL | 11 refills | Status: DC
Start: 2019-11-06 — End: 2020-02-05

## 2019-11-11 ENCOUNTER — Telehealth: Payer: Self-pay | Admitting: *Deleted

## 2019-11-11 NOTE — Telephone Encounter (Signed)
LMOM to call me back to set up 24 hour ambulatory EEG.

## 2019-12-09 ENCOUNTER — Ambulatory Visit (INDEPENDENT_AMBULATORY_CARE_PROVIDER_SITE_OTHER): Payer: Federal, State, Local not specified - PPO | Admitting: Neurology

## 2019-12-09 ENCOUNTER — Other Ambulatory Visit: Payer: Self-pay

## 2019-12-09 DIAGNOSIS — R569 Unspecified convulsions: Secondary | ICD-10-CM | POA: Diagnosis not present

## 2019-12-28 ENCOUNTER — Telehealth: Payer: Self-pay | Admitting: Neurology

## 2019-12-28 DIAGNOSIS — Z79899 Other long term (current) drug therapy: Secondary | ICD-10-CM

## 2019-12-28 NOTE — Telephone Encounter (Signed)
Patient called in and left a message wanting to get his EEG results from 12/11/19.

## 2019-12-28 NOTE — Telephone Encounter (Signed)
Spoke with pt informed him his EEG was normal. It is not like a pregnancy test that is positive or negative. Also, the seizure medication can make the EEG normal as well. Continue on same dose of Keppra. Pt verbalized understanding   Does he still want me to fill out the [facility] form? Yes, pt stated that he wanted Dr Karel Jarvis to do [facility] forms he stated that he had his lab work done when ordered at [facility] stated he would call [facility] to have them fax results to [place] if they cant, he will come in and have the lab work done ,

## 2019-12-28 NOTE — Telephone Encounter (Signed)
Pls let patient/dad know that the EEG was normal. It is not like a pregnancy test that is positive or negative. Also, the seizure medication can make the EEG normal as well. Continue on same dose of Keppra. Does he still want me to fill out the [facility] form? If yes, he will need a Keppra level done, thanks!

## 2019-12-28 NOTE — Procedures (Signed)
ELECTROENCEPHALOGRAM REPORT  Dates of Recording: 12/09/2019 9:58AM to 12/10/2019 10:06AM  Patient's Name: Paul Gardner MRN: [id number] Date of Birth: 1996-02-23  Referring Provider: Dr. Patrcia Dolly  Procedure: 24-hour ambulatory video EEG  History: This is a 24 year old man with recurrent nocturnal seizures, most recently in August 2021.  Medications:  Keppra  Technical Summary: This is a 24-hour multichannel digital video EEG recording measured by the international 10-20 system with electrodes applied with paste and impedances below 5000 ohms performed as portable with EKG monitoring.  The digital EEG was referentially recorded, reformatted, and digitally filtered in a variety of bipolar and referential montages for optimal display.    DESCRIPTION OF RECORDING: During maximal wakefulness, the background activity consisted of a symmetric 10 Hz posterior dominant rhythm which was reactive to eye opening.  There were no epileptiform discharges or focal slowing seen in wakefulness.  During the recording, the patient progresses through wakefulness, drowsiness, and Stage 2 sleep.  Again, there were no epileptiform discharges seen.  Events: On 10/6 at 2115 hours, he had a left-sided headache. Patient not on video. Electrographically, there were no EEG or EKG changes seen.  There were no electrographic seizures seen.  EKG lead was unremarkable.  IMPRESSION: This 24-hour ambulatory video EEG study is normal.    CLINICAL CORRELATION: A normal EEG does not exclude a clinical diagnosis of epilepsy.Typical events were not captured.  If further clinical questions remain, inpatient video EEG monitoring may be helpful.   Patrcia Dolly, M.D.

## 2019-12-28 NOTE — Addendum Note (Signed)
Addended by: Dimas Chyle on: 12/28/2019 04:17 PM   Modules accepted: Orders

## 2019-12-29 ENCOUNTER — Telehealth: Payer: Self-pay | Admitting: Neurology

## 2019-12-29 NOTE — Telephone Encounter (Signed)
Patient left message with after hours stating he had an EEG done and was told to get bloodwork done, but states he needs the results faxed.

## 2019-12-29 NOTE — Telephone Encounter (Signed)
[facility] called results faxed to the office

## 2020-01-12 ENCOUNTER — Telehealth: Payer: Self-pay

## 2020-01-12 NOTE — Telephone Encounter (Signed)
DMV paperwork faxed to [phone] °

## 2020-02-05 ENCOUNTER — Other Ambulatory Visit: Payer: Self-pay | Admitting: Neurology

## 2020-02-05 ENCOUNTER — Telehealth: Payer: Self-pay | Admitting: Neurology

## 2020-02-05 MED ORDER — LEVETIRACETAM 750 MG PO TABS
750.0000 mg | ORAL_TABLET | Freq: Two times a day (BID) | ORAL | 0 refills | Status: DC
Start: 2020-02-05 — End: 2020-02-24

## 2020-02-05 NOTE — Telephone Encounter (Signed)
Patient's father called to report that patient had a seizure on 01/17/20 and he had another one this morning that lasted 2-3 minutes. Father isn't sure if Dr Karel Jarvis wanted to keep patient's medications the same or if she would want to change them. Please call.

## 2020-02-05 NOTE — Telephone Encounter (Signed)
Pt c/o: seizure Missed medications?  No. Sleep deprived?  Yes.   Alcohol intake?  No. Back to their usual baseline self?  Yes.  . If no, advise go to ER Pt is eating good He has increase in stress just started new job girl friend started same job was going to be his ride but they but her on a different shift.  Current medications prescribed by Dr. Karel Jarvis: Keppra 500mg  BID Pt is getting tired of testing  Pt father asking if Bipolar can be a trigger for Seizures?

## 2020-02-05 NOTE — Telephone Encounter (Signed)
Spoke with pt father informed him that [place] needs to increase Keppra to 750mg  twice daily Per Dr . To remember  As per [place] law, he should not drive for 6 months from last seizure. I also let him know that There is an association with having seizures and having a mood disorder such as Bipolar disorder. Pt father verbalized understanding and new script sent to pharmacy on file,

## 2020-02-05 NOTE — Telephone Encounter (Signed)
I would increase Keppra to 750mg  twice daily.  As per [place] law, he should not drive for 6 months from last seizure. There is an association with having seizures and having a mood disorder such as Bipolar disorder.

## 2020-02-24 ENCOUNTER — Encounter: Payer: Self-pay | Admitting: Neurology

## 2020-02-24 ENCOUNTER — Other Ambulatory Visit: Payer: Self-pay

## 2020-02-24 ENCOUNTER — Ambulatory Visit: Payer: Federal, State, Local not specified - PPO | Admitting: Neurology

## 2020-02-24 VITALS — BP 133/84 | HR 97 | Resp 18 | Ht 70.0 in | Wt 151.0 lb

## 2020-02-24 DIAGNOSIS — R569 Unspecified convulsions: Secondary | ICD-10-CM | POA: Diagnosis not present

## 2020-02-24 MED ORDER — LEVETIRACETAM 1000 MG PO TABS: 1000.0000 mg | ORAL_TABLET | Freq: Two times a day (BID) | ORAL | 3 refills | Status: DC

## 2020-02-24 NOTE — Patient Instructions (Signed)
1. Increase Keppra to 1000mg : take 1 tablet in AM, 1 tablet in PM  2. Follow-up in 4 months, call for any changes   Seizure Precautions: 1. If medication has been prescribed for you to prevent seizures, take it exactly as directed.  Do not stop taking the medicine without talking to your doctor first, even if you have not had a seizure in a long time.   2. Avoid activities in which a seizure would cause danger to yourself or to others.  Don't operate dangerous machinery, swim alone, or climb in high or dangerous places, such as on ladders, roofs, or girders.  Do not drive unless your doctor says you may.  3. If you have any warning that you may have a seizure, lay down in a safe place where you can't hurt yourself.    4.  No driving for 6 months from last seizure, as per [facility].   Please refer to the following link on the [employer] of [employer]'s website for more information: http://www.epilepsyfoundation.org/answerplace/Social/driving/drivingu.cfm   5.  Maintain good sleep hygiene. Avoid alcohol.  6.  Contact your doctor if you have any problems that may be related to the medicine you are taking.  7.  Call 911 and bring the patient back to the ED if:        A.  The seizure lasts longer than 5 minutes.       B.  The patient doesn't awaken shortly after the seizure  C.  The patient has new problems such as difficulty seeing, speaking or moving  D.  The patient was injured during the seizure  E.  The patient has a temperature over 102 F (39C)  F.  The patient vomited and now is having trouble breathing

## 2020-02-24 NOTE — Progress Notes (Signed)
NEUROLOGY FOLLOW UP OFFICE NOTE  Paul Gardner [id number] [birth year]  HISTORY OF PRESENT ILLNESS: I had the pleasure of seeing Paul Gardner in follow-up in the neurology clinic on 02/24/2020.  The patient was last seen 4 months ago for nocturnal seizures. He is again accompanied by his father who helps supplement the history today. Since his last visit, he had a 24-hour EEG in 12/2019 which was normal, typical events not captured. They called our office to report a nocturnal seizure on 01/17/20, then another on 02/05/20. No tongue bite or incontinence. He reported an increase in stress with his new job. His father is unhappy to report that he lost his job because he could not drive to work. 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 He is on Levetiracetam 750mg  BID without side effects, last dose change was after the last seizure on 12/3. He denies any staring/unresponsive episodes, gaps in time, myoclonic jerks, focal numbness/tingling/weakness, headaches, dizziness, no falls. Mood is good. Her gets around 6-8 hours of sleep. He and his father endorse stress due to loss of employment.    History on Initial Assessment 04/14/2019: Paul Gardner is a 24 year old right-handed man with a history of glaucoma, in his usual state of health until he had a nocturnal seizure on 02/09/2019. He works third shift at [employer] and after getting home from work, he recalls eating and feeling dehydrated, then waking up to EMS around him. He was asleep and his girlfriend noticed twitching, eyes rolled back, and entire body stiffened for 1-2 minutes. He was drowsy and minimally responsive when his father came upstairs, with sonorous respirations. This lasted 20-30 minutes until he returned to baseline. No tongue bite, incontinence, focal weakness. It was noted in the ER that he had decreased appetite and stomach issues for months. He also reported intermittent  headaches. In the ER, CBC, CMP, UDS were negative. I personally reviewed head CT without contrast which did not show any acute changes. He has been feeling fine since then, with no further seizures. He and his father deny any staring/unresponsive episodes, gaps in time, olfactory/gustatory hallucinations, rising epigastric sensation (except for GERD-like symptoms), focal numbness/tingling/weakness. He has occasional twitching. He denies any significant headaches. He denies any dizziness, diplopia, dysarthria/dysphagia, neck pain, bowel/bladder dysfunction. He has some back pain from work. He denies any alcohol or drug use. He has had some sleep difficulties due to third shift at work.  Epilepsy Risk Factors:  He had a normal birth and early development.  There is no history of febrile convulsions, CNS infections such as meningitis/encephalitis, significant traumatic brain injury, neurosurgical procedures, or family history of seizures.  Diagnostic Data: MRI brain with and without contrast 05/2019 normal. 1-hour EEG in 04/2019 within normal limits. 24-hour EEG in 12/2019 normal, typical events not captured.   PAST MEDICAL HISTORY: Past Medical History:  Diagnosis Date  . Glaucoma    left eye  . Seizures (HCC)     MEDICATIONS: No current outpatient medications on file prior to visit.   No current facility-administered medications on file prior to visit.    ALLERGIES: No Known Allergies  FAMILY HISTORY: Family History  Problem Relation Age of Onset  . Mental illness Mother   . Arthritis Maternal Aunt   . Arthritis Maternal Grandmother   . Alcohol abuse Maternal Grandfather   . Arthritis Paternal Grandmother   . Hypertension Paternal Grandfather   . Hyperlipidemia Paternal Grandfather  SOCIAL HISTORY: Social History   Socioeconomic History  . Marital status: Single    Spouse name: Not on file  . Number of children: Not on file  . Years of education: Not on file  . Highest  education level: Not on file  Occupational History  . Not on file  Tobacco Use  . Smoking status: Current Every Day Smoker    Types: E-cigarettes  . Smokeless tobacco: Never Used  Vaping Use  . Vaping Use: Former  Substance and Sexual Activity  . Alcohol use: No    Alcohol/week: 0.0 standard drinks  . Drug use: Yes    Types: Marijuana  . Sexual activity: Not on file  Other Topics Concern  . Not on file  Social History Narrative   Right handed   Lives with family   2 story home    Drinks no caffeine - Drinks sweet tea occasional    Social Determinants of Health   Financial Resource Strain: Not on file  Food Insecurity: Not on file  Transportation Needs: Not on file  Physical Activity: Not on file  Stress: Not on file  Social Connections: Not on file  Intimate Partner Violence: Not on file     PHYSICAL EXAM: Vitals:   02/24/20 0812  BP: 133/84  Pulse: 97  Resp: 18  SpO2: 98%   General: No acute distress Head:  Normocephalic/atraumatic Skin/Extremities: No rash, no edema Neurological Exam: alert and awake. No aphasia or dysarthria. Fund of knowledge is appropriate.  Recent and remote memory are intact.  Attention and concentration are normal.   Cranial nerves: Pupils equal, round. Extraocular movements intact with no nystagmus. Visual fields full.  No facial asymmetry.  Motor: Bulk and tone normal, muscle strength 5/5 throughout with no pronator drift.   Finger to nose testing intact.  Gait narrow-based and steady, able to tandem walk adequately.  Romberg negative.   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 His MRI brain and 24-hour EEG are normal. His last nocturnal seizure was on 02/05/20. We discussed increasing Levetiracetam to 1000mg  BID. His father asks about bipolar disorder due to his mother's history, he denies any mood issues. We will request for an exemption through the [facility] for exclusive  nocturnal seizures. Follow-up in 4 months, they know to call for any changes.   Thank you for allowing me to participate in his care.  Please do not hesitate to call for any questions or concerns.   [facility], M.D.   CC: Dr. Patrcia Dolly

## 2020-04-09 IMAGING — MR MR HEAD WO/W CM
13 of 14 series · 40 of 48 positions shown · IV contrast (multihance)
Comparison: CT head 02/09/2019

CLINICAL DATA: New onset seizure

EXAM:
MRI HEAD WITHOUT AND WITH CONTRAST
TECHNIQUE: Multiplanar, multiecho pulse sequences of the brain and surrounding
structures were obtained without and with intravenous contrast.
CONTRAST:  14mL MULTIHANCE GADOBENATE DIMEGLUMINE 529 MG/ML IV SOLN

[Series 2: T1 · sagittal · 5.0mm · 0.45mm/px · 1 of 21 slices shown]
[im 1/21]
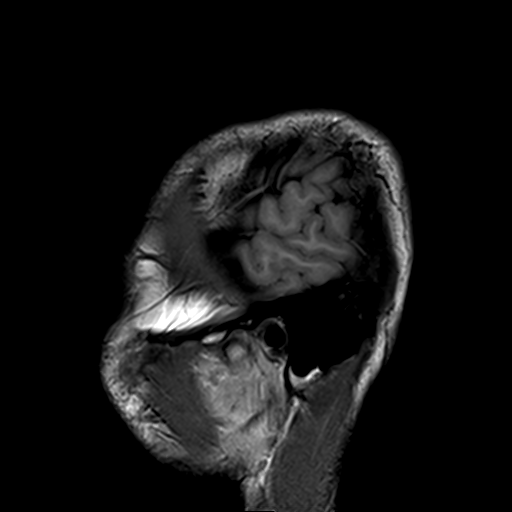

[Series 3: DWI · axial · 3.0mm · 1.80mm/px · z∈[-60,+87]mm · 5 of 100 slices shown (1 of 4)]
[im 1/100]
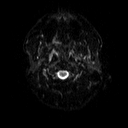
[im 25/100]
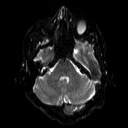
[im 50/100]
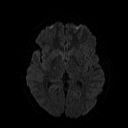
[im 75/100]
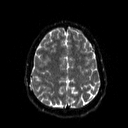
[im 100/100]
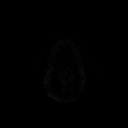

[Series 4: DWI · axial · 3.0mm · 1.80mm/px · z∈[-60,+87]mm · 2 of 47 slices shown (2 of 4)]
[im 1/47]
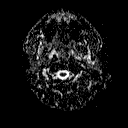
[im 47/47]
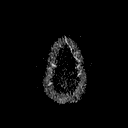

[Series 6: swi_images · axial · 2.0mm · 0.90mm/px · z∈[-65,+93]mm · 5 of 80 slices shown]
[im 1/80]
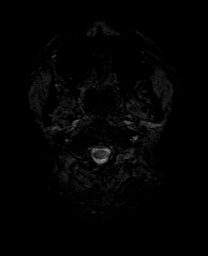
[im 20/80]
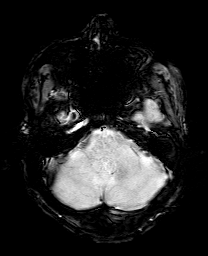
[im 40/80]
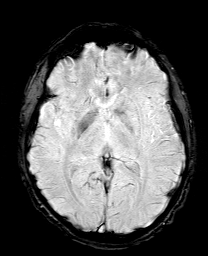
[im 60/80]
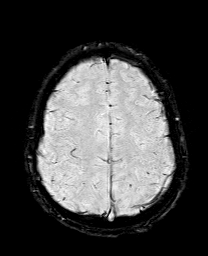
[im 80/80]
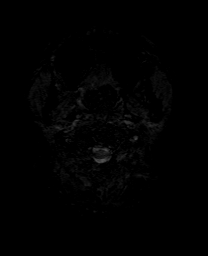

[Series 7: T2 · axial · 5.0mm · 0.60mm/px · 1 of 22 slices shown (1 of 3)]
[im 1/22]
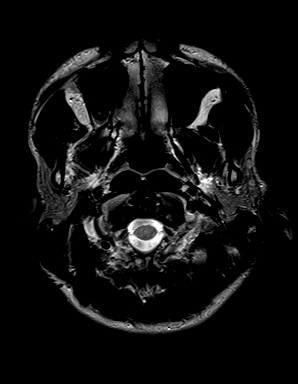

[Series 8: DWI · coronal · 5.0mm · 1.80mm/px · 4 of 68 slices shown (3 of 4)]
[im 1/68]
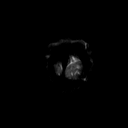
[im 23/68]
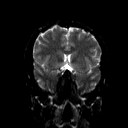
[im 45/68]
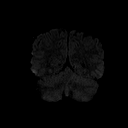
[im 68/68]
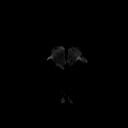

[Series 9: DWI · coronal · 5.0mm · 1.80mm/px · 2 of 34 slices shown (4 of 4)]
[im 1/34]
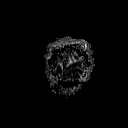
[im 34/34]
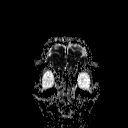

[Series 10: FLAIR · coronal · 3.0mm · 0.70mm/px · 2 of 28 slices shown (1 of 2)]
[im 1/28]
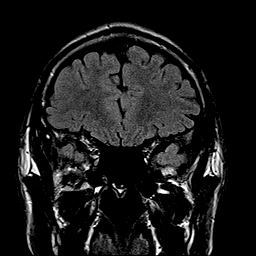
[im 28/28]
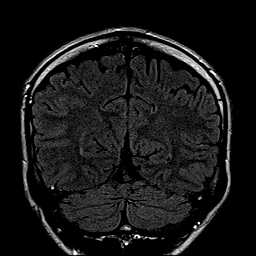

[Series 11: T2 · coronal · 3.0mm · 0.28mm/px · 2 of 28 slices shown (2 of 3)]
[im 1/28]
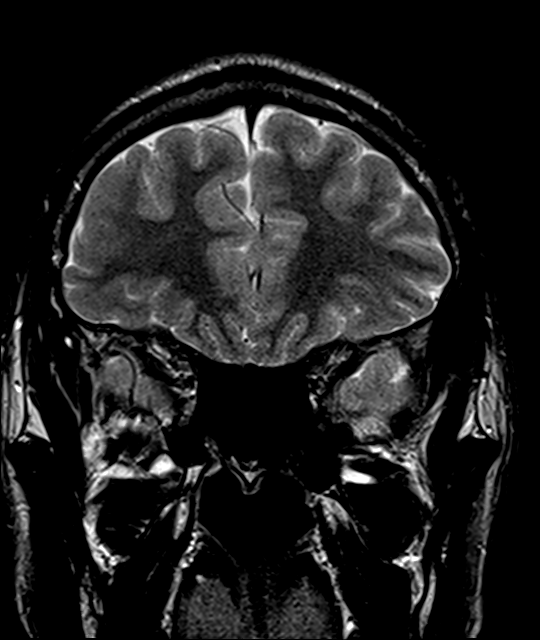
[im 28/28]
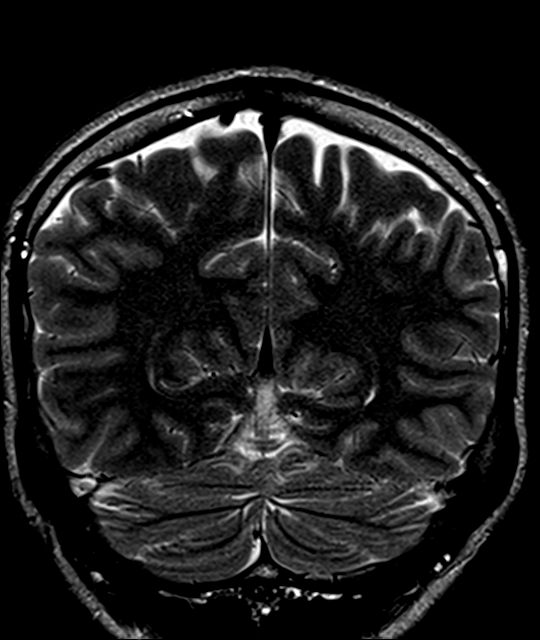

[Series 12: FLAIR · axial · 3.0mm · 0.45mm/px · z∈[-64,+91]mm · 2 of 27 slices shown (2 of 2)]
[im 1/27]
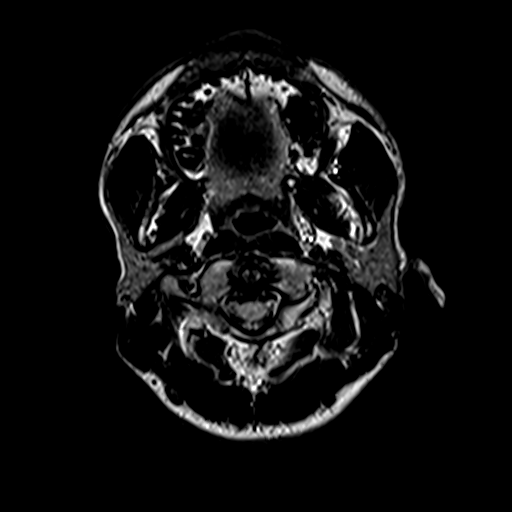
[im 27/27]
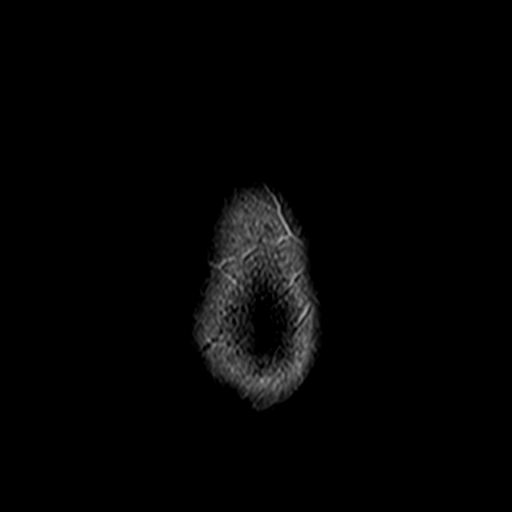

[Series 13: t1_mpr_tra copy center · axial · 1.0mm · 0.45mm/px · z∈[-65,+94]mm · 8 of 160 slices shown]
[im 1/160]
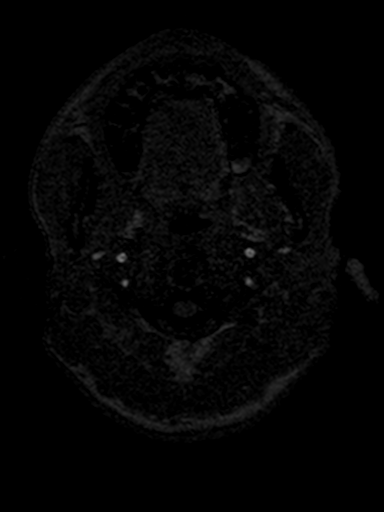
[im 20/160]
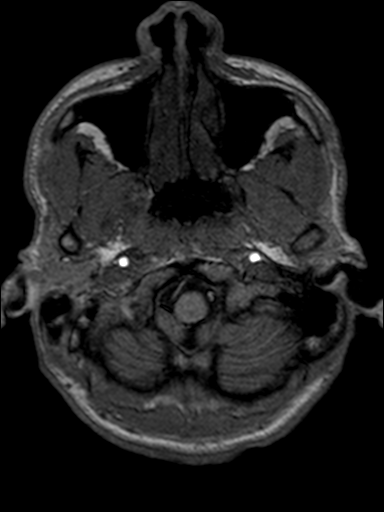
[im 40/160]
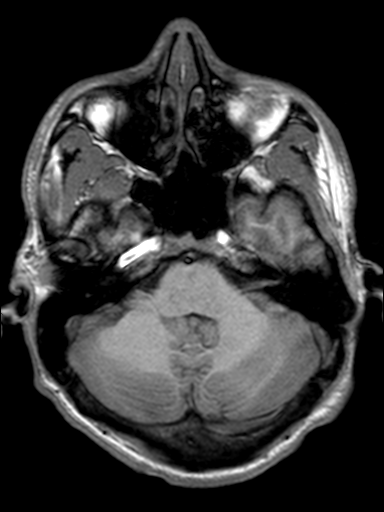
[im 60/160]
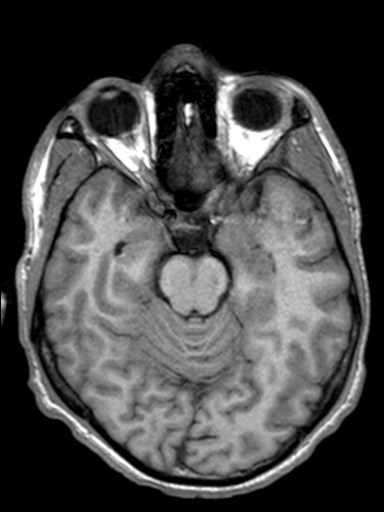
[im 100/160]
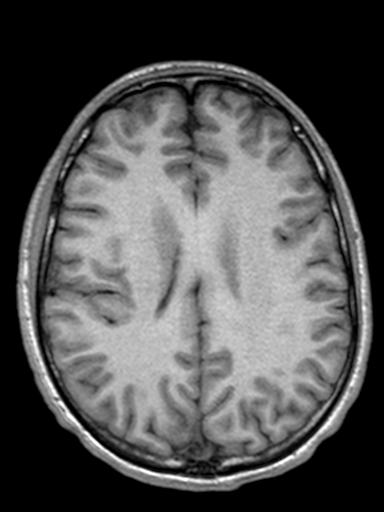
[im 120/160]
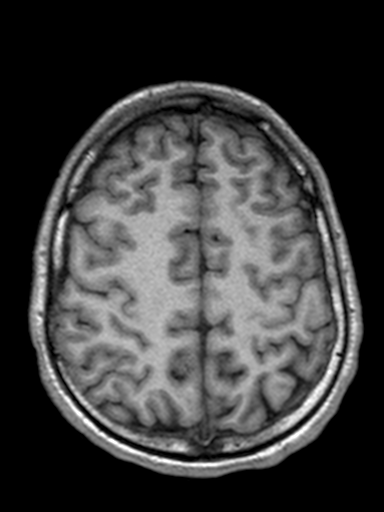
[im 140/160]
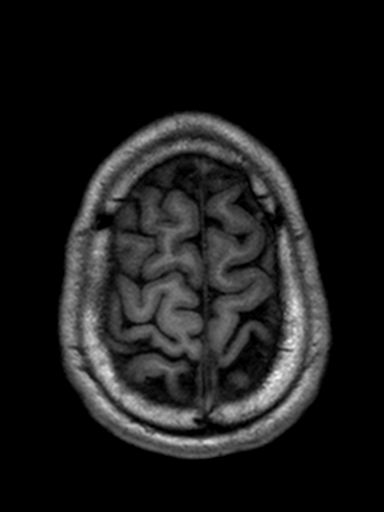
[im 160/160]
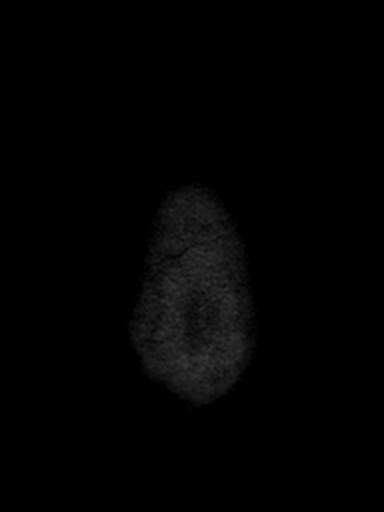

[Series 14: T2 · coronal · 5.0mm · 0.45mm/px · 2 of 29 slices shown (3 of 3)]
[im 1/29]
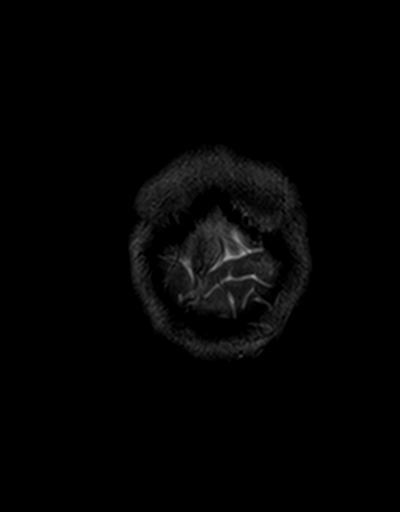
[im 29/29]
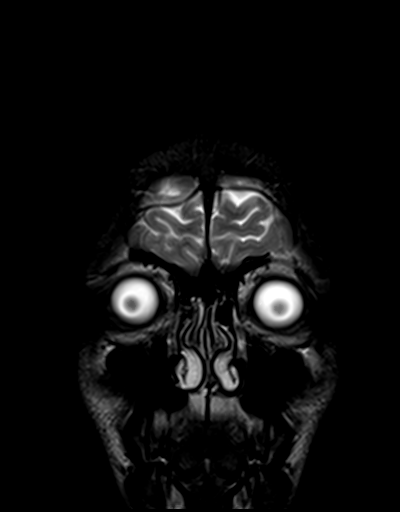

[Series 15: t1_mpr_tra · axial · 1.0mm · 0.45mm/px · z∈[-65,-6]mm · 4 of 160 slices shown]
[im 1/160]
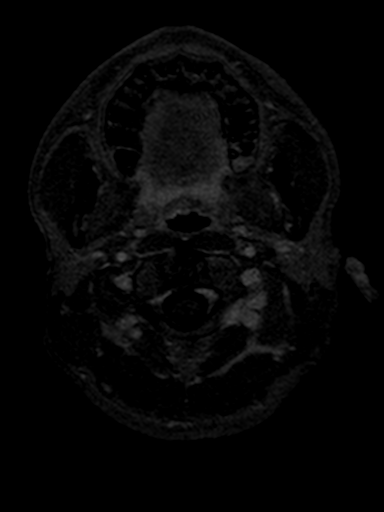
[im 20/160]
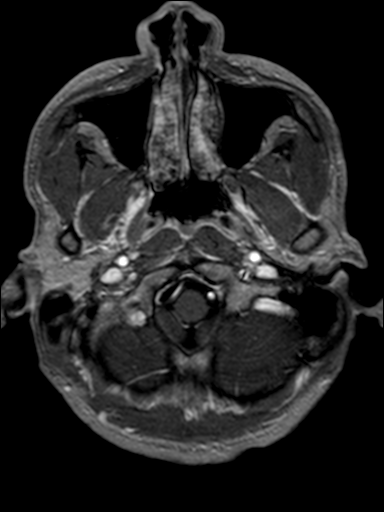
[im 40/160]
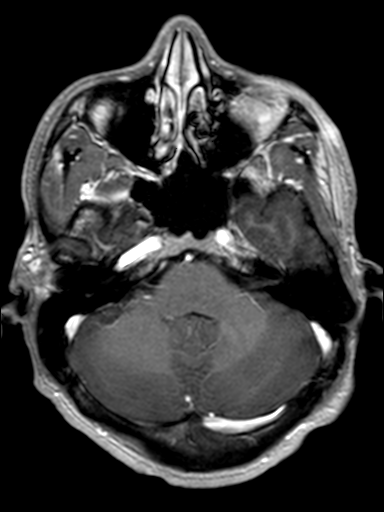
[im 60/160]
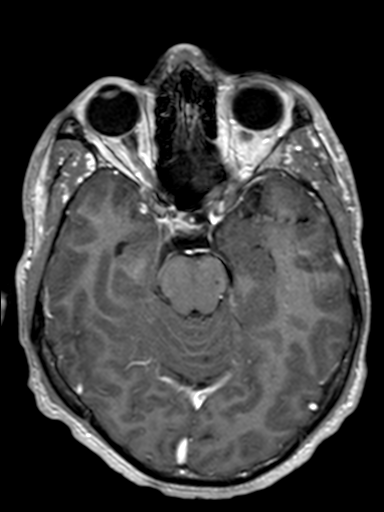

[40 of 48 positions shown; findings below may reference images not displayed]

FINDINGS: Brain: No acute infarction, hemorrhage, hydrocephalus, extra-axial
collection or mass lesion. Normal white matter. Mesial temporal lobe
normal bilaterally.

Normal enhancement following contrast infusion

Vascular: Normal arterial flow voids.

Skull and upper cervical spine: No focal skeletal lesion.

Sinuses/Orbits: Negative

Other: None
IMPRESSION: Normal MRI brain with contrast.

## 2020-07-04 ENCOUNTER — Ambulatory Visit: Payer: Federal, State, Local not specified - PPO | Admitting: Neurology

## 2020-07-04 ENCOUNTER — Other Ambulatory Visit: Payer: Self-pay

## 2020-07-04 ENCOUNTER — Encounter: Payer: Self-pay | Admitting: Neurology

## 2020-07-04 VITALS — BP 143/80 | HR 78 | Ht 70.0 in | Wt 152.2 lb

## 2020-07-04 DIAGNOSIS — R569 Unspecified convulsions: Secondary | ICD-10-CM

## 2020-07-04 MED ORDER — LAMOTRIGINE 100 MG PO TABS: ORAL_TABLET | ORAL | 6 refills | Status: DC

## 2020-07-04 MED ORDER — LAMOTRIGINE 25 MG PO TABS: ORAL_TABLET | ORAL | 0 refills | Status: DC

## 2020-07-04 NOTE — Patient Instructions (Signed)
1. Start Lamotrigine 25mg : take 1 tablet twice a day for 2 weeks, then increase to 2 tablets twice a day for 2 weeks, then start Lamotrigine 100mg : take 1 tablet twice a day and continue  2. Continue Keppra 1000mg  twice a day  3. Follow-up in 3 months, call for any changes   Seizure Precautions: 1. If medication has been prescribed for you to prevent seizures, take it exactly as directed.  Do not stop taking the medicine without talking to your doctor first, even if you have not had a seizure in a long time.   2. Avoid activities in which a seizure would cause danger to yourself or to others.  Don't operate dangerous machinery, swim alone, or climb in high or dangerous places, such as on ladders, roofs, or girders.  Do not drive unless your doctor says you may.  3. If you have any warning that you may have a seizure, lay down in a safe place where you can't hurt yourself.    4.  No driving for 6 months from last seizure, as per [facility].   Please refer to the following link on the [employer]ile[employer] of [employer]'s website for more information: http://www.epilepsyfoundation.org/answerplace/Social/driving/drivingu.cfm   5.  Maintain good sleep hygiene. Avoid alcohol.  6.  Contact your doctor if you have any problems that may be related to the medicine you are taking.  7.  Call 911 and bring the patient back to the ED if:        A.  The seizure lasts longer than 5 minutes.       B.  The patient doesn't awaken shortly after the seizure  C.  The patient has new problems such as difficulty seeing, speaking or moving  D.  The patient was injured during the seizure  E.  The patient has a temperature over 102 F (39C)  F.  The patient vomited and now is having trouble breathing

## 2020-07-04 NOTE — Progress Notes (Signed)
NEUROLOGY FOLLOW UP OFFICE NOTE  Paul Gardner [id number] Dec 04, 1995  HISTORY OF PRESENT ILLNESS: I had the pleasure of seeing Paul Gardner in follow-up in the neurology clinic on 07/04/2020.  The patient was last seen 4 months ago for nocturnal seizures. He is again accompanied by his father who helps supplement the history today.  Records and images were personally reviewed where available. On his last visit, Levetiracetam dose was increased to 1000mg  BID. Since his last visit, he reports having 2 seizures in one day last 06/17/20. Both were again in sleep, he had one at 10am, then another at noon. He vomited with them and had a headache after. No focal weakness, tongue bite or incontinence. He has occasional body jerks throughout the day. He and his father deny any staring/unresponsive episodes, gaps in time. No dizziness, vision changes, no falls. He feels sleepy and tired on the Levetiracetam, mood is "tired." He feels he got enough sleep prior to the seizure.    History on Initial Assessment 04/14/2019: Paul Gardner is a 25 year old right-handed man with a history of glaucoma, in his usual state of health until he had a nocturnal seizure on 02/09/2019. He works third shift at [employer] and after getting home from work, he recalls eating and feeling dehydrated, then waking up to EMS around him. He was asleep and his girlfriend noticed twitching, eyes rolled back, and entire body stiffened for 1-2 minutes. He was drowsy and minimally responsive when his father came upstairs, with sonorous respirations. This lasted 20-30 minutes until he returned to baseline. No tongue bite, incontinence, focal weakness. It was noted in the ER that he had decreased appetite and stomach issues for months. He also reported intermittent headaches. In the ER, CBC, CMP, UDS were negative. I personally reviewed head CT without contrast which did not show any acute changes. He has been feeling fine since then, with no further  seizures. He and his father deny any staring/unresponsive episodes, gaps in time, olfactory/gustatory hallucinations, rising epigastric sensation (except for GERD-like symptoms), focal numbness/tingling/weakness. He has occasional twitching. He denies any significant headaches. He denies any dizziness, diplopia, dysarthria/dysphagia, neck pain, bowel/bladder dysfunction. He has some back pain from work. He denies any alcohol or drug use. He has had some sleep difficulties due to third shift at work.  Epilepsy Risk Factors:  He had a normal birth and early development.  There is no history of febrile convulsions, CNS infections such as meningitis/encephalitis, significant traumatic brain injury, neurosurgical procedures, or family history of seizures.  Diagnostic Data: MRI brain with and without contrast 05/2019 normal. 1-hour EEG in 04/2019 within normal limits. 24-hour EEG in 12/2019 normal, typical events not captured.   PAST MEDICAL HISTORY: Past Medical History:  Diagnosis Date  . Glaucoma    left eye  . Seizures (HCC)     MEDICATIONS: Current Outpatient Medications on File Prior to Visit  Medication Sig Dispense Refill  . levETIRAcetam (KEPPRA) 1000 MG tablet Take 1 tablet (1,000 mg total) by mouth 2 (two) times daily. 180 tablet 3   No current facility-administered medications on file prior to visit.    ALLERGIES: No Known Allergies  FAMILY HISTORY: Family History  Problem Relation Age of Onset  . Mental illness Mother   . Arthritis Maternal Aunt   . Arthritis Maternal Grandmother   . Alcohol abuse Maternal Grandfather   . Arthritis Paternal Grandmother   . Hypertension Paternal Grandfather   . Hyperlipidemia Paternal Grandfather     SOCIAL HISTORY:  Social History   Socioeconomic History  . Marital status: Single    Spouse name: Not on file  . Number of children: Not on file  . Years of education: Not on file  . Highest education level: Not on file  Occupational  History  . Not on file  Tobacco Use  . Smoking status: Current Every Day Smoker    Types: E-cigarettes  . Smokeless tobacco: Never Used  Vaping Use  . Vaping Use: Former  Substance and Sexual Activity  . Alcohol use: No    Alcohol/week: 0.0 standard drinks  . Drug use: Not Currently    Types: Marijuana  . Sexual activity: Not on file  Other Topics Concern  . Not on file  Social History Narrative   Right handed   Lives with family   2 story home    Drinks no caffeine - Drinks sweet tea occasional    Social Determinants of Health   Financial Resource Strain: Not on file  Food Insecurity: Not on file  Transportation Needs: Not on file  Physical Activity: Not on file  Stress: Not on file  Social Connections: Not on file  Intimate Partner Violence: Not on file     PHYSICAL EXAM: Vitals:   07/04/20 0849  BP: (!) 143/80  Pulse: 78  SpO2: 100%   General: No acute distress Head:  Normocephalic/atraumatic Skin/Extremities: No rash, no edema Neurological Exam: alert and oriented to person, place, and time. No aphasia or dysarthria. Fund of knowledge is appropriate.  Recent and remote memory are intact.  Attention and concentration are normal.   Cranial nerves: Pupils equal, round. Extraocular movements intact with no nystagmus. Visual fields full.  No facial asymmetry.  Motor: Bulk and tone normal, muscle strength 5/5 throughout with no pronator drift.   Finger to nose testing intact.  Gait narrow-based and steady, able to tandem walk adequately.  Romberg negative.   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 His MRI brain and 24-hour EEG are normal. He had 2 nocturnal seizures on 06/17/2020 on Levetiracetam 1000mg  BID and feels tired and sleepy on this. We discussed adding on Lamotrigine with plans to wean off Levetiracetam on his next visit. Side effects, including syndrome, were discussed.  Start Lamotrigine 25mg  BID x 2 weeks, then increase to 50mg  BID x 2 weeks, then 100mg  BID. Continue Levetiracetam 1000mg  BID for now. We again discussed [place] driving laws, he has received an exemption from the [facility] since his seizures are all nocturnal. Follow-up in 3 months, call for any changes.   Thank you for allowing me to participate in his care.  Please do not hesitate to call for any questions or concerns.   , M.D.   CC: Dr. 

## 2020-07-04 NOTE — Progress Notes (Signed)
Seizure 2 weeks ago had 2 in one day,

## 2020-10-03 ENCOUNTER — Encounter: Payer: Self-pay | Admitting: Neurology

## 2020-10-03 ENCOUNTER — Ambulatory Visit: Payer: Federal, State, Local not specified - PPO | Admitting: Neurology

## 2020-10-03 ENCOUNTER — Other Ambulatory Visit: Payer: Self-pay

## 2020-10-03 VITALS — BP 131/76 | HR 56 | Ht 70.0 in | Wt 141.2 lb

## 2020-10-03 DIAGNOSIS — R569 Unspecified convulsions: Secondary | ICD-10-CM

## 2020-10-03 MED ORDER — LAMOTRIGINE 100 MG PO TABS
ORAL_TABLET | ORAL | 3 refills | Status: DC
Start: 2020-10-03 — End: 2021-02-21

## 2020-10-03 NOTE — Patient Instructions (Signed)
Good to see you!  Continue Levetiracetam (Keppra) 1000mg  twice a day  2. Continue Lamotrigine (Lamictal) 100mg  twice a day  3. Follow-up in 4 months, call for any changes   Seizure Precautions: 1. If medication has been prescribed for you to prevent seizures, take it exactly as directed.  Do not stop taking the medicine without talking to your doctor first, even if you have not had a seizure in a long time.   2. Avoid activities in which a seizure would cause danger to yourself or to others.  Don't operate dangerous machinery, swim alone, or climb in high or dangerous places, such as on ladders, roofs, or girders.  Do not drive unless your doctor says you may.  3. If you have any warning that you may have a seizure, lay down in a safe place where you can't hurt yourself.    4.  No driving for 6 months from last seizure, as per [facility].   Please refer to the following link on the Epile[employer] Foundation of [employer]'s website for more information: http://www.epilepsyfoundation.org/answerplace/Social/driving/drivingu.cfm   5.  Maintain good sleep hygiene. Avoid alcohol  6.  Contact your doctor if you have any problems that may be related to the medicine you are taking.  7.  Call 911 and bring the patient back to the ED if:        A.  The seizure lasts longer than 5 minutes.       B.  The patient doesn't awaken shortly after the seizure  C.  The patient has new problems such as difficulty seeing, speaking or moving  D.  The patient was injured during the seizure  E.  The patient has a temperature over 102 F (39C)  F.  The patient vomited and now is having trouble breathing

## 2020-10-03 NOTE — Progress Notes (Signed)
NEUROLOGY FOLLOW UP OFFICE NOTE  Paul Gardner [id number] 01-10-1996  HISTORY OF PRESENT ILLNESS: I had the pleasure of seeing Paul Gardner in follow-up in the neurology clinic on 10/03/2020.  The patient was last seen 3 months ago for nocturnal seizures. He is again accompanied by his father who helps supplement today.  Records and images were personally reviewed where available.  On his last visit, he continued to report seizures on Levetiracetam, as well as side effects. He was started on Lamotrigine, currently on 100mg  BID. He continues on Levetiracetam 1000mg  BID. Last seizure was 06/17/20. They deny any staring/unresponsive episodes, gaps in time, olfactory/gustatory hallucinations, focal numbness/tingling/weakness, myoclonic jerks. No significant headaches, sometimes he has headaches at work due to bright lights. No dizziness, vision changes, no falls. He was previously noting drowsiness and fatigue on Levetiracetam, this has resolved, mood is good, he denies any side effects on medications. He works third shift at and reports getting a good amount of sleep.   History on Initial Assessment 04/14/2019: Paul Gardner is a 25 year old right-handed man with a history of glaucoma, in his usual state of health until he had a nocturnal seizure on 02/09/2019. He works third shift at 38 and after getting home from work, he recalls eating and feeling dehydrated, then waking up to EMS around him. He was asleep and his girlfriend noticed twitching, eyes rolled back, and entire body stiffened for 1-2 minutes. He was drowsy and minimally responsive when his father came upstairs, with sonorous respirations. This lasted 20-30 minutes until he returned to baseline. No tongue bite, incontinence, focal weakness. It was noted in the ER that he had decreased appetite and stomach issues for months. He also reported intermittent headaches. In the ER, CBC, CMP, UDS were negative. I personally reviewed head CT without  contrast which did not show any acute changes. He has been feeling fine since then, with no further seizures. He and his father deny any staring/unresponsive episodes, gaps in time, olfactory/gustatory hallucinations, rising epigastric sensation (except for GERD-like symptoms), focal numbness/tingling/weakness. He has occasional twitching. He denies any significant headaches. He denies any dizziness, diplopia, dysarthria/dysphagia, neck pain, bowel/bladder dysfunction. He has some back pain from work. He denies any alcohol or drug use. He has had some sleep difficulties due to third shift at work.  Epilepsy Risk Factors:  He had a normal birth and early development.  There is no history of febrile convulsions, CNS infections such as meningitis/encephalitis, significant traumatic brain injury, neurosurgical procedures, or family history of seizures.  Diagnostic Data: MRI brain with and without contrast 05/2019 normal. 1-hour EEG in 04/2019 within normal limits. 24-hour EEG in 12/2019 normal, typical events not captured.   PAST MEDICAL HISTORY: Past Medical History:  Diagnosis Date   Glaucoma    left eye   Seizures (HCC)     MEDICATIONS: Current Outpatient Medications on File Prior to Visit  Medication Sig Dispense Refill   lamoTRIgine (LAMICTAL) 100 MG tablet After finishing Lamotrigine 25mg  prescription, start Lamotrigine 100mg : take 1 tablet twice a day 60 tablet 6   levETIRAcetam (KEPPRA) 1000 MG tablet Take 1 tablet (1,000 mg total) by mouth 2 (two) times daily. 180 tablet 3   No current facility-administered medications on file prior to visit.    ALLERGIES: No Known Allergies  FAMILY HISTORY: Family History  Problem Relation Age of Onset   Mental illness Mother    Arthritis Maternal Aunt    Arthritis Maternal Grandmother    Alcohol abuse Maternal  Grandfather    Arthritis Paternal Grandmother    Hypertension Paternal Grandfather    Hyperlipidemia Paternal Grandfather      SOCIAL HISTORY: Social History   Socioeconomic History   Marital status: Single    Spouse name: Not on file   Number of children: Not on file   Years of education: Not on file   Highest education level: Not on file  Occupational History   Not on file  Tobacco Use   Smoking status: Every Day    Types: E-cigarettes, Cigarettes   Smokeless tobacco: Never  Vaping Use   Vaping Use: Former  Substance and Sexual Activity   Alcohol use: No    Alcohol/week: 0.0 standard drinks   Drug use: Not Currently    Types: Marijuana   Sexual activity: Not on file  Other Topics Concern   Not on file  Social History Narrative   Right handed   Lives with family   2 story home    Drinks no caffeine - Drinks sweet tea occasional    Social Determinants of Corporate investment banker Strain: Not on file  Food Insecurity: Not on file  Transportation Needs: Not on file  Physical Activity: Not on file  Stress: Not on file  Social Connections: Not on file  Intimate Partner Violence: Not on file     PHYSICAL EXAM: Vitals:   10/03/20 1340  BP: 131/76  Pulse: (!) 56  SpO2: 97%   General: No acute distress Head:  Normocephalic/atraumatic Skin/Extremities: No rash, no edema Neurological Exam: alert and awake. No aphasia or dysarthria. Fund of knowledge is appropriate.  Recent and remote memory are intact.  Attention and concentration are normal.   Cranial nerves: Pupils equal, round. Extraocular movements intact with no nystagmus. Visual fields full.  No facial asymmetry.  Motor: Bulk and tone normal, muscle strength 5/5 throughout with no pronator drift.   Finger to nose testing intact.  Gait narrow-based and steady, able to tandem walk adequately.  Romberg negative.   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 His MRI brain and 24-hour EEG are normal. He is on Lamotrigine 100mg  BID and Levetiracetam 1000mg  BID without  seizures since 06/17/20, no side effects. Since he is tolerating both medications and doing well, we have agreed to continue on dual therapy. We discussed avoidance of seizure triggers, including missing medication, sleep deprivation, and alcohol. He is aware of [place] driving laws to stop driving after a seizure until 6 months seizure-free. Follow-up in 4 months, call for any changes.    Thank you for allowing me to participate in his care.  Please do not hesitate to call for any questions or concerns.   , M.D.   CC: Dr. 06/19/20

## 2021-02-21 ENCOUNTER — Other Ambulatory Visit: Payer: Self-pay

## 2021-02-21 ENCOUNTER — Ambulatory Visit: Payer: Federal, State, Local not specified - PPO | Admitting: Neurology

## 2021-02-21 ENCOUNTER — Encounter: Payer: Self-pay | Admitting: Neurology

## 2021-02-21 VITALS — BP 110/70 | HR 74 | Resp 18 | Ht 70.0 in | Wt 144.0 lb

## 2021-02-21 DIAGNOSIS — R569 Unspecified convulsions: Secondary | ICD-10-CM

## 2021-02-21 MED ORDER — LEVETIRACETAM 1000 MG PO TABS
1000.0000 mg | ORAL_TABLET | Freq: Two times a day (BID) | ORAL | 3 refills | Status: DC
Start: 2021-02-21 — End: 2021-08-22

## 2021-02-21 MED ORDER — LAMOTRIGINE 100 MG PO TABS
ORAL_TABLET | ORAL | 3 refills | Status: DC
Start: 2021-02-21 — End: 2021-08-22

## 2021-02-21 NOTE — Patient Instructions (Signed)
Good to see you! Continue all your medications, refills sent. Follow-up in 6 months, call for any changes.   Seizure Precautions: 1. If medication has been prescribed for you to prevent seizures, take it exactly as directed.  Do not stop taking the medicine without talking to your doctor first, even if you have not had a seizure in a long time.   2. Avoid activities in which a seizure would cause danger to yourself or to others.  Don't operate dangerous machinery, swim alone, or climb in high or dangerous places, such as on ladders, roofs, or girders.  Do not drive unless your doctor says you may.  3. If you have any warning that you may have a seizure, lay down in a safe place where you can't hurt yourself.    4.  No driving for 6 months from last seizure, as per [facility].   Please refer to the following link on the Epilepsy Foundation of America's website for more information: http://www.epilepsyfoundation.org/answerplace/Social/driving/drivingu.cfm   5.  Maintain good sleep hygiene. Avoid alcohol.  6.  Contact your doctor if you have any problems that may be related to the medicine you are taking.  7.  Call 911 and bring the patient back to the ED if:        A.  The seizure lasts longer than 5 minutes.       B.  The patient doesn't awaken shortly after the seizure  C.  The patient has new problems such as difficulty seeing, speaking or moving  D.  The patient was injured during the seizure  E.  The patient has a temperature over 102 F (39C)  F.  The patient vomited and now is having trouble breathing

## 2021-02-21 NOTE — Progress Notes (Signed)
NEUROLOGY FOLLOW UP OFFICE NOTE  Paul Gardner [id number] Aug 31, 1995  HISTORY OF PRESENT ILLNESS: I had the pleasure of seeing Paul Gardner in follow-up in the neurology clinic on 02/21/2021.  The patient was last seen 4 months ago for nocturnal seizures. He is again accompanied by his father who helps supplement the history today.  Since his last visit, he continues to do well seizure-free since 06/2020. He is on Lamotrigine 100mg  BID and Levetiracetam 1000mg  BID without side effects. He wonders about hair loss, he has noticed hair loss in his eyebrows as well. He and his father deny any staring/unresponsive episodes, gaps in time, olfactory/gustatory hallucinations, focal numbness/tingling/weakness, myoclonic jerks. No significant headaches, dizziness, vision changes, no falls. He sleeps 7 hours and works third shift. Mood is tired but happy.   History on Initial Assessment 04/14/2019: Paul Gardner is a 25 year old right-handed man with a history of glaucoma, in his usual state of health until he had a nocturnal seizure on 02/09/2019. He works third shift at 38 and after getting home from work, he recalls eating and feeling dehydrated, then waking up to EMS around him. He was asleep and his girlfriend noticed twitching, eyes rolled back, and entire body stiffened for 1-2 minutes. He was drowsy and minimally responsive when his father came upstairs, with sonorous respirations. This lasted 20-30 minutes until he returned to baseline. No tongue bite, incontinence, focal weakness. It was noted in the ER that he had decreased appetite and stomach issues for months. He also reported intermittent headaches. In the ER, CBC, CMP, UDS were negative. I personally reviewed head CT without contrast which did not show any acute changes. He has been feeling fine since then, with no further seizures. He and his father deny any staring/unresponsive episodes, gaps in time, olfactory/gustatory hallucinations, rising  epigastric sensation (except for GERD-like symptoms), focal numbness/tingling/weakness. He has occasional twitching. He denies any significant headaches. He denies any dizziness, diplopia, dysarthria/dysphagia, neck pain, bowel/bladder dysfunction. He has some back pain from work. He denies any alcohol or drug use. He has had some sleep difficulties due to third shift at work.  Epilepsy Risk Factors:  He had a normal birth and early development.  There is no history of febrile convulsions, CNS infections such as meningitis/encephalitis, significant traumatic brain injury, neurosurgical procedures, or family history of seizures.  Diagnostic Data: MRI brain with and without contrast 05/2019 normal. 1-hour EEG in 04/2019 within normal limits. 24-hour EEG in 12/2019 normal, typical events not captured.   PAST MEDICAL HISTORY: Past Medical History:  Diagnosis Date   Glaucoma    left eye   Seizures (HCC)     MEDICATIONS: Current Outpatient Medications on File Prior to Visit  Medication Sig Dispense Refill   lamoTRIgine (LAMICTAL) 100 MG tablet Take 1 tablet twice a day 180 tablet 3   levETIRAcetam (KEPPRA) 1000 MG tablet Take 1 tablet (1,000 mg total) by mouth 2 (two) times daily. 180 tablet 3   No current facility-administered medications on file prior to visit.    ALLERGIES: No Known Allergies  FAMILY HISTORY: Family History  Problem Relation Age of Onset   Mental illness Mother    Arthritis Maternal Aunt    Arthritis Maternal Grandmother    Alcohol abuse Maternal Grandfather    Arthritis Paternal Grandmother    Hypertension Paternal Grandfather    Hyperlipidemia Paternal Grandfather     SOCIAL HISTORY: Social History   Socioeconomic History   Marital status: Single    Spouse name:  Not on file   Number of children: Not on file   Years of education: Not on file   Highest education level: Not on file  Occupational History   Not on file  Tobacco Use   Smoking status:  Every Day    Types: E-cigarettes, Cigarettes   Smokeless tobacco: Never  Vaping Use   Vaping Use: Former  Substance and Sexual Activity   Alcohol use: No    Alcohol/week: 0.0 standard drinks   Drug use: Not Currently    Types: Marijuana   Sexual activity: Not on file  Other Topics Concern   Not on file  Social History Narrative   Right handed   Lives with family   2 story home    Drinks no caffeine - Drinks sweet tea occasional    Social Determinants of Corporate investment banker Strain: Not on file  Food Insecurity: Not on file  Transportation Needs: Not on file  Physical Activity: Not on file  Stress: Not on file  Social Connections: Not on file  Intimate Partner Violence: Not on file     PHYSICAL EXAM: Vitals:   02/21/21 1340  BP: 110/70  Pulse: 74  Resp: 18  SpO2: 98%   General: No acute distress Head:  Normocephalic/atraumatic Skin/Extremities: No rash, no edema Neurological Exam: alert and awake. No aphasia or dysarthria. Fund of knowledge is appropriate.   Attention and concentration are normal.   Cranial nerves: Pupils equal, round. Extraocular movements intact with no nystagmus. Visual fields full.  No facial asymmetry.  Motor: Bulk and tone normal, muscle strength 5/5 throughout with no pronator drift.   Finger to nose testing intact.  Gait narrow-based and steady, able to tandem walk adequately.  Romberg negative.   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 His MRI brain and 24-hour EEG are normal. He has been seizure-free since 06/17/2020 on Lamotrigine 100mg  BID and Levetiracetam 1000mg  BID. He wonders about hair loss as side effect of medications, these are not typical side effects of his current medications, would rule out other causes with PCP first, as he is doing well with no seizures, would weigh risks and benefits of medication. We again discussed avoidance of seizure triggers,  including missing medications, sleep deprivation, and alcohol. He is aware of [place] driving laws to stop driving after a seizure until 6 months seizure-free. Follow-up in 6 months, call for any changes.    Thank you for allowing me to participate in his care.  Please do not hesitate to call for any questions or concerns.    , M.D.   CC: Dr. 

## 2021-04-10 ENCOUNTER — Telehealth: Payer: Self-pay | Admitting: Neurology

## 2021-04-10 DIAGNOSIS — Z0279 Encounter for issue of other medical certificate: Secondary | ICD-10-CM

## 2021-04-10 NOTE — Telephone Encounter (Signed)
Patient called about his seizures, said he was returning a call to [facility], sent back to [employer]

## 2021-04-10 NOTE — Telephone Encounter (Signed)
For [facility] paperwork pt stated that last seizure was either march or June of 2022

## 2021-04-13 NOTE — Telephone Encounter (Signed)
Paperwork done, thanks

## 2021-04-13 NOTE — Telephone Encounter (Signed)
Sent to front for payment so we can fax and mail

## 2021-04-14 NOTE — Telephone Encounter (Signed)
Faxed and mailed DMV paperwork

## 2021-05-18 ENCOUNTER — Telehealth: Payer: Self-pay

## 2021-05-18 NOTE — Telephone Encounter (Signed)
-----   Message from Van Clines, MD sent at 05/18/2021  8:36 AM EDT ----- ?Can you pls call [facility] and let him know I got a note from his insurance that he has not filled his Lamotrigine recently, has he been taking it with the Levetiracetam? He should be on both, thanks ? ? ?----- Message ----- ?From: Cordero, Danna W ?Sent: 05/18/2021   7:34 AM EDT ?To: Van Clines, MD ? ? ?

## 2021-05-18 NOTE — Telephone Encounter (Signed)
Pt called he stated that he is taken both the lamotrigine and the levetiracetam, he stated he had a lot of the lamotrigine and that he is going to get it refilled soon  ?

## 2021-08-22 ENCOUNTER — Encounter: Payer: Self-pay | Admitting: Neurology

## 2021-08-22 ENCOUNTER — Ambulatory Visit: Payer: Federal, State, Local not specified - PPO | Admitting: Neurology

## 2021-08-22 VITALS — BP 138/89 | HR 75 | Ht 70.0 in | Wt 148.0 lb

## 2021-08-22 DIAGNOSIS — R569 Unspecified convulsions: Secondary | ICD-10-CM

## 2021-08-22 MED ORDER — LAMOTRIGINE 100 MG PO TABS: ORAL_TABLET | ORAL | 3 refills | Status: DC

## 2021-08-22 MED ORDER — LEVETIRACETAM 1000 MG PO TABS: 1000.0000 mg | ORAL_TABLET | Freq: Two times a day (BID) | ORAL | 3 refills | Status: DC

## 2021-08-22 NOTE — Progress Notes (Signed)
NEUROLOGY FOLLOW UP OFFICE NOTE  Paul Gardner [id number] [birth year]  HISTORY OF PRESENT ILLNESS: I had the pleasure of seeing Paul Gardner in follow-up in the neurology clinic on 08/22/2021.  The patient was last seen 6 months ago for nocturnal seizures. He is again accompanied by his father who helps supplement the history today.  Records and images were personally reviewed where available. Since his last visit, he continues to do well seizure-free since 06/2020 on Lamotrigine 100mg  BID and Levetiracetam 1000mg  BID, no side effects. He and his father deny any staring/unresponsive episodes, gaps in time, olfactory/gustatory hallucinations, focal numbness/tingling/weakness, myoclonic jerks. No headaches, dizziness, vision changes, no falls. Sleep and mood are good. He is not working third shift anymore, he works for now. He lives with his father.    History on Initial Assessment 04/14/2019: Paul Gardner is a 26 year old right-handed man with a history of glaucoma, in his usual state of health until he had a nocturnal seizure on 02/09/2019. He works third shift at 38 and after getting home from work, he recalls eating and feeling dehydrated, then waking up to EMS around him. He was asleep and his girlfriend noticed twitching, eyes rolled back, and entire body stiffened for 1-2 minutes. He was drowsy and minimally responsive when his father came upstairs, with sonorous respirations. This lasted 20-30 minutes until he returned to baseline. No tongue bite, incontinence, focal weakness. It was noted in the ER that he had decreased appetite and stomach issues for months. He also reported intermittent headaches. In the ER, CBC, CMP, UDS were negative. I personally reviewed head CT without contrast which did not show any acute changes. He has been feeling fine since then, with no further seizures. He and his father deny any staring/unresponsive episodes, gaps in time, olfactory/gustatory  hallucinations, rising epigastric sensation (except for GERD-like symptoms), focal numbness/tingling/weakness. He has occasional twitching. He denies any significant headaches. He denies any dizziness, diplopia, dysarthria/dysphagia, neck pain, bowel/bladder dysfunction. He has some back pain from work. He denies any alcohol or drug use. He has had some sleep difficulties due to third shift at work.  Epilepsy Risk Factors:  He had a normal birth and early development.  There is no history of febrile convulsions, CNS infections such as meningitis/encephalitis, significant traumatic brain injury, neurosurgical procedures, or family history of seizures.  Diagnostic Data: MRI brain with and without contrast 05/2019 normal. 1-hour EEG in 04/2019 within normal limits. 24-hour EEG in 12/2019 normal, typical events not captured.  PAST MEDICAL HISTORY: Past Medical History:  Diagnosis Date   Glaucoma    left eye   Seizures (HCC)     MEDICATIONS: Current Outpatient Medications on File Prior to Visit  Medication Sig Dispense Refill   lamoTRIgine (LAMICTAL) 100 MG tablet Take 1 tablet twice a day 180 tablet 3   levETIRAcetam (KEPPRA) 1000 MG tablet Take 1 tablet (1,000 mg total) by mouth 2 (two) times daily. 180 tablet 3   No current facility-administered medications on file prior to visit.    ALLERGIES: No Known Allergies  FAMILY HISTORY: Family History  Problem Relation Age of Onset   Mental illness Mother    Arthritis Maternal Aunt    Arthritis Maternal Grandmother    Alcohol abuse Maternal Grandfather    Arthritis Paternal Grandmother    Hypertension Paternal Grandfather    Hyperlipidemia Paternal Grandfather     SOCIAL HISTORY: Social History   Socioeconomic History   Marital status: Single    Spouse name:  Not on file   Number of children: Not on file   Years of education: Not on file   Highest education level: Not on file  Occupational History   Not on file  Tobacco Use    Smoking status: Every Day    Types: E-cigarettes, Cigarettes   Smokeless tobacco: Never  Vaping Use   Vaping Use: Every day   Devices: Loss Mary  Substance and Sexual Activity   Alcohol use: No    Comment: occasional   Drug use: Not Currently    Types: Marijuana   Sexual activity: Not on file  Other Topics Concern   Not on file  Social History Narrative   Right handed   Lives with family   2 story home    Drinks no caffeine - Drinks sweet tea occasional    Social Determinants of Health   Financial Resource Strain: Not on file  Food Insecurity: Not on file  Transportation Needs: Not on file  Physical Activity: Not on file  Stress: Not on file  Social Connections: Not on file  Intimate Partner Violence: Not on file     PHYSICAL EXAM: Vitals:   08/22/21 1549  BP: 138/89  Pulse: 75  SpO2: 100%   General: No acute distress Head:  Normocephalic/atraumatic Skin/Extremities: No rash, no edema Neurological Exam: alert and awake. No aphasia or dysarthria. Fund of knowledge is appropriate.  Attention and concentration are normal.   Cranial nerves: Pupils equal, round. Extraocular movements intact with no nystagmus. Visual fields full.  No facial asymmetry.  Motor: Bulk and tone normal, muscle strength 5/5 throughout with no pronator drift.   Finger to nose testing intact.  Gait narrow-based and steady, able to tandem walk adequately.  Romberg negative.   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 His MRI brain and 24-hour EEG are normal. He continues to do well seizure-free since 06/17/2020 on Lamotrigine 100mg  BID and Levetiracetam 1000mg  BID. We again discussed avoidance of seizure triggers. He is aware of [place] driving laws to stop driving after a seizure until 6 months seizure-free. Follow-up in 8 months, call for any changes.    Thank you for allowing me to participate in his care.  Please do not hesitate to  call for any questions or concerns.    , M.D.   CC: Dr. 

## 2021-08-22 NOTE — Patient Instructions (Signed)
Good to see you doing well. Continue Lamotrigine 100mg  twice a day and Levetiracetam 1000mg  twice a day. Follow-up in 8 months, call for any changes.    Seizure Precautions: 1. If medication has been prescribed for you to prevent seizures, take it exactly as directed.  Do not stop taking the medicine without talking to your doctor first, even if you have not had a seizure in a long time.   2. Avoid activities in which a seizure would cause danger to yourself or to others.  Don't operate dangerous machinery, swim alone, or climb in high or dangerous places, such as on ladders, roofs, or girders.  Do not drive unless your doctor says you may.  3. If you have any warning that you may have a seizure, lay down in a safe place where you can't hurt yourself.    4.  No driving for 6 months from last seizure, as per [facility].   Please refer to the following link on the Epilepsy Foundation of America's website for more information: http://www.epilepsyfoundation.org/answerplace/Social/driving/drivingu.cfm   5.  Maintain good sleep hygiene. Avoid alcohol.  6.  Contact your doctor if you have any problems that may be related to the medicine you are taking.  7.  Call 911 and bring the patient back to the ED if:        A.  The seizure lasts longer than 5 minutes.       B.  The patient doesn't awaken shortly after the seizure  C.  The patient has new problems such as difficulty seeing, speaking or moving  D.  The patient was injured during the seizure  E.  The patient has a temperature over 102 F (39C)  F.  The patient vomited and now is having trouble breathing

## 2022-04-24 ENCOUNTER — Ambulatory Visit: Payer: Federal, State, Local not specified - PPO | Admitting: Neurology

## 2022-04-24 ENCOUNTER — Encounter: Payer: Self-pay | Admitting: Neurology

## 2022-04-24 VITALS — BP 135/82 | HR 92 | Ht 70.0 in | Wt 168.8 lb

## 2022-04-24 DIAGNOSIS — R569 Unspecified convulsions: Secondary | ICD-10-CM

## 2022-04-24 NOTE — Progress Notes (Signed)
NEUROLOGY FOLLOW UP OFFICE NOTE  Paul Gardner KM:[id number] [birth year]  HISTORY OF PRESENT ILLNESS: I had the pleasure of seeing Paul Gardner in follow-up in the neurology clinic on 04/24/2022.  The patient was last seen 8 months ago for nocturnal seizures. He is again accompanied by his father who helps supplement the history today.  Records and images were personally reviewed where available. Since his last visit, he continues to do well seizure-free since 06/2020 on Lamotrigine 252m BID and Levetiracetam 10025mBID, no side effects. They deny any staring/unresponsive episodes, gaps in time, olfactory/gustatory hallucinations, focal numbness/tingling/weakness, myoclonic jerks. No significant headaches, dizziness, vision changes, no falls. He gets 8-9 hours of sleep. 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 He lives with his father.    History on Initial Assessment 04/14/2019: Paul Gardner a 27year old right-handed man with a history of glaucoma, in his usual state of health until he had a nocturnal seizure on 02/09/2019. He works third shift at [employer] after getting home from work, he recalls eating and feeling dehydrated, then waking up to EMS around him. He was asleep and his girlfriend noticed twitching, eyes rolled back, and entire body stiffened for 1-2 minutes. He was drowsy and minimally responsive when his father came upstairs, with sonorous respirations. This lasted 20-30 minutes until he returned to baseline. No tongue bite, incontinence, focal weakness. It was noted in the ER that he had decreased appetite and stomach issues for months. He also reported intermittent headaches. In the ER, CBC, CMP, UDS were negative. I personally reviewed head CT without contrast which did not show any acute changes. He has been feeling fine since then, with no further seizures. He and his father deny any staring/unresponsive episodes, gaps in time, olfactory/gustatory  hallucinations, rising epigastric sensation (except for GERD-like symptoms), focal numbness/tingling/weakness. He has occasional twitching. He denies any significant headaches. He denies any dizziness, diplopia, dysarthria/dysphagia, neck pain, bowel/bladder dysfunction. He has some back pain from work. He denies any alcohol or drug use. He has had some sleep difficulties due to third shift at work.  Epilepsy Risk Factors:  He had a normal birth and early development.  There is no history of febrile convulsions, CNS infections such as meningitis/encephalitis, significant traumatic brain injury, neurosurgical procedures, or family history of seizures.  Diagnostic Data: MRI brain with and without contrast 05/2019 normal. 1-hour EEG in 04/2019 within normal limits. 24-hour EEG in 12/2019 normal, typical events not captured.   PAST MEDICAL HISTORY: Past Medical History:  Diagnosis Date   Glaucoma    left eye   Seizures (HCElmo    MEDICATIONS: Current Outpatient Medications on File Prior to Visit  Medication Sig Dispense Refill   lamoTRIgine (LAMICTAL) 100 MG tablet Take 1 tablet twice a day 180 tablet 3   levETIRAcetam (KEPPRA) 1000 MG tablet Take 1 tablet (1,000 mg total) by mouth 2 (two) times daily. 180 tablet 3   No current facility-administered medications on file prior to visit.    ALLERGIES: No Known Allergies  FAMILY HISTORY: Family History  Problem Relation Age of Onset   Mental illness Mother    Arthritis Maternal Aunt    Arthritis Maternal Grandmother    Alcohol abuse Maternal Grandfather    Arthritis Paternal Grandmother    Hypertension Paternal Grandfather    Hyperlipidemia Paternal Grandfather     SOCIAL HISTORY: Social History   Socioeconomic History   Marital status: Single    Spouse name:  Not on file   Number of children: Not on file   Years of education: Not on file   Highest education level: Not on file  Occupational History   Not on file  Tobacco Use    Smoking status: Every Day    Types: E-cigarettes, Cigarettes   Smokeless tobacco: Never  Vaping Use   Vaping Use: Former   Devices: Loss Mary  Substance and Sexual Activity   Alcohol use: Yes    Comment: occasional   Drug use: Not Currently    Types: Marijuana   Sexual activity: Not on file  Other Topics Concern   Not on file  Social History Narrative   Right handed   Lives with family   2 story home    Drinks no caffeine - Drinks sweet tea occasional    Social Determinants of Radio broadcast assistant Strain: Not on file  Food Insecurity: Not on file  Transportation Needs: Not on file  Physical Activity: Not on file  Stress: Not on file  Social Connections: Not on file  Intimate Partner Violence: Not on file     PHYSICAL EXAM: Vitals:   04/24/22 1445  BP: 135/82  Pulse: 92  SpO2: 96%   General: No acute distress Head:  Normocephalic/atraumatic Skin/Extremities: No rash, no edema Neurological Exam: alert and awake. No aphasia or dysarthria. Fund of knowledge is appropriate.  Attention and concentration are normal.   Cranial nerves: Pupils equal, round. Extraocular movements intact with no nystagmus. Visual fields full.  No facial asymmetry.  Motor: Bulk and tone normal, muscle strength 5/5 throughout with no pronator drift.   Finger to nose testing intact.  Gait narrow-based and steady, able to tandem walk adequately.  Romberg negative.   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 His MRI brain and 24-hour EEG are normal. He has been seizure-free since 06/2020 on Lamotrigine 251m BID and Levetiracetam 100257mBID, refills sent. We again discussed avoidance of seizure triggers. He is aware of [place] driving laws to stop driving after a seizure until 6 months seizure-free. Follow-up in 1 year, call for any changes.   Thank you for allowing me to participate in his care.  Please do not hesitate to call  for any questions or concerns.    KaEllouise NewerM.D.   CC: Dr. ToGlori Bickers

## 2022-04-24 NOTE — Patient Instructions (Signed)
Good to see you doing well. Continue Lamotrigine 139m twice a day and Levetiracetam 10012mtwice a day. Follow-up in 1 year, call for any changes.    Seizure Precautions: 1. If medication has been prescribed for you to prevent seizures, take it exactly as directed.  Do not stop taking the medicine without talking to your doctor first, even if you have not had a seizure in a long time.   2. Avoid activities in which a seizure would cause danger to yourself or to others.  Don't operate dangerous machinery, swim alone, or climb in high or dangerous places, such as on ladders, roofs, or girders.  Do not drive unless your doctor says you may.  3. If you have any warning that you may have a seizure, lay down in a safe place where you can't hurt yourself.    4.  No driving for 6 months from last seizure, as per [facility]  Please refer to the following link on the EpLawrenceebsite for more information: http://www.epilepsyfoundation.org/answerplace/Social/driving/drivingu.cfm   5.  Maintain good sleep hygiene. Avoid alcohol.  6.  Contact your doctor if you have any problems that may be related to the medicine you are taking.  7.  Call 911 and bring the patient back to the ED if:        A.  The seizure lasts longer than 5 minutes.       B.  The patient doesn't awaken shortly after the seizure  C.  The patient has new problems such as difficulty seeing, speaking or moving  D.  The patient was injured during the seizure  E.  The patient has a temperature over 102 F (39C)  F.  The patient vomited and now is having trouble breathing

## 2022-11-19 ENCOUNTER — Other Ambulatory Visit: Payer: Self-pay | Admitting: Neurology

## 2023-04-25 ENCOUNTER — Telehealth (INDEPENDENT_AMBULATORY_CARE_PROVIDER_SITE_OTHER): Payer: Federal, State, Local not specified - PPO | Admitting: Neurology

## 2023-04-25 ENCOUNTER — Encounter: Payer: Self-pay | Admitting: Neurology

## 2023-04-25 DIAGNOSIS — R569 Unspecified convulsions: Secondary | ICD-10-CM

## 2023-04-25 MED ORDER — LAMOTRIGINE 100 MG PO TABS: ORAL_TABLET | ORAL | 4 refills | Status: AC

## 2023-04-25 MED ORDER — LEVETIRACETAM 1000 MG PO TABS: 1000.0000 mg | ORAL_TABLET | Freq: Two times a day (BID) | ORAL | 4 refills | Status: AC

## 2023-04-25 NOTE — Patient Instructions (Signed)
Good to see you! Continue Lamotrigine 100mg  twice a day and Levetiracetam 1000mg  twice a day. Follow-up in 1 year, call for any changes.    Seizure Precautions: 1. If medication has been prescribed for you to prevent seizures, take it exactly as directed.  Do not stop taking the medicine without talking to your doctor first, even if you have not had a seizure in a long time.   2. Avoid activities in which a seizure would cause danger to yourself or to others.  Don't operate dangerous machinery, swim alone, or climb in high or dangerous places, such as on ladders, roofs, or girders.  Do not drive unless your doctor says you may.  3. If you have any warning that you may have a seizure, lay down in a safe place where you can't hurt yourself.    4.  No driving for 6 months from last seizure, as per [facility].   Please refer to the following link on the Epilepsy Foundation of America's website for more information: http://www.epilepsyfoundation.org/answerplace/Social/driving/drivingu.cfm   5.  Maintain good sleep hygiene. Avoid alcohol.  6.  Contact your doctor if you have any problems that may be related to the medicine you are taking.  7.  Call 911 and bring the patient back to the ED if:        A.  The seizure lasts longer than 5 minutes.       B.  The patient doesn't awaken shortly after the seizure  C.  The patient has new problems such as difficulty seeing, speaking or moving  D.  The patient was injured during the seizure  E.  The patient has a temperature over 102 F (39C)  F.  The patient vomited and now is having trouble breathing

## 2023-04-25 NOTE — Progress Notes (Signed)
Virtual Visit via Video Note The purpose of this virtual visit is to provide medical care while limiting exposure to the novel coronavirus.    Consent was obtained for video visit:  Yes.   Answered questions that patient had about telehealth interaction:  Yes.     Pt location: Home Physician Location: office Name of referring provider:  Tower, Audrie Gallus, MD I connected with Paul Gardner at patients initiation/request on 04/25/2023 at 10:00 AM EST by video enabled telemedicine application and verified that I am speaking with the correct person using two identifiers. Pt MRN:  [id number] Pt DOB:  07-09-1995 Video Participants:  Paul Gardner   History of Present Illness:  The patient had a virtual video visit on 2/202/2025. He was last seen a year ago for nocturnal seizures. Since his last visit, he continues to do well seizure-free since 06/2020 on Lamotrigine 100mg  BID and Levetiracetam 1000mg  BID, no side effects. He denies any staring/unresponsive episodes, gaps in time, olfactory/gustatory hallucinations, focal numbness/tingling/weakness, myoclonic jerks. No headaches, dizziness, vision changes, no falls. He gets 7-8 hours of sleep. Mood is good. He works for [employer].    History on Initial Assessment 04/14/2019: Paul Gardner is a 28 year old right-handed man with a history of glaucoma, in his usual state of health until he had a nocturnal seizure on 02/09/2019. He works third shift at [employer] and after getting home from work, he recalls eating and feeling dehydrated, then waking up to EMS around him. He was asleep and his girlfriend noticed twitching, eyes rolled back, and entire body stiffened for 1-2 minutes. He was drowsy and minimally responsive when his father came upstairs, with sonorous respirations. This lasted 20-30 minutes until he returned to baseline. No tongue bite, incontinence, focal weakness. It was noted in the ER that he had decreased appetite and stomach issues for months. He  also reported intermittent headaches. In the ER, CBC, CMP, UDS were negative. I personally reviewed head CT without contrast which did not show any acute changes. He has been feeling fine since then, with no further seizures. He and his father deny any staring/unresponsive episodes, gaps in time, olfactory/gustatory hallucinations, rising epigastric sensation (except for GERD-like symptoms), focal numbness/tingling/weakness. He has occasional twitching. He denies any significant headaches. He denies any dizziness, diplopia, dysarthria/dysphagia, neck pain, bowel/bladder dysfunction. He has some back pain from work. He denies any alcohol or drug use. He has had some sleep difficulties due to third shift at work.  Epilepsy Risk Factors:  He had a normal birth and early development.  There is no history of febrile convulsions, CNS infections such as meningitis/encephalitis, significant traumatic brain injury, neurosurgical procedures, or family history of seizures.  Diagnostic Data: MRI brain with and without contrast 05/2019 normal. 1-hour EEG in 04/2019 within normal limits. 24-hour EEG in 12/2019 normal, typical events not captured.   Current Outpatient Medications on File Prior to Visit  Medication Sig Dispense Refill   lamoTRIgine (LAMICTAL) 100 MG tablet TAKE 1 TABLET BY MOUTH TWICE DAILY 180 tablet 1   levETIRAcetam (KEPPRA) 1000 MG tablet TAKE 1 TABLET(1000 MG) BY MOUTH TWICE DAILY 180 tablet 1   No current facility-administered medications on file prior to visit.     Observations/Objective:   GEN:  The patient appears stated age and is in NAD. Neurological examination: Patient is awake, alert. No aphasia or dysarthria. Intact fluency and comprehension. Cranial nerves: Extraocular movements intact with no nystagmus. No facial asymmetry. Motor: moves all extremities symmetrically, at least anti-gravity  x 4. No incoordination on finger to nose testing. Gait: narrow-based and steady, able to  tandem walk adequately.    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 His MRI brain and 24-hour EEG are normal. He has been seizure-free since 06/2020 on Lamotrigine 100mg  BID and Levetiracetam 1000mg  BID, refills sent. He is aware of [place] driving laws to stop driving after a seizure until 6 months seizure-free. Follow-up in 1 year, call for any changes.    Follow Up Instructions:   -I discussed the assessment and treatment plan with the patient. The patient was provided an opportunity to ask questions and all were answered. The patient agreed with the plan and demonstrated an understanding of the instructions.   The patient was advised to call back or seek an in-person evaluation if the symptoms worsen or if the condition fails to improve as anticipated.     Van Clines, MD

## 2023-08-22 ENCOUNTER — Telehealth: Payer: Self-pay | Admitting: Neurology

## 2023-08-22 NOTE — Telephone Encounter (Signed)
 PT.s father dropped off DMV inbox

## 2023-08-22 NOTE — Telephone Encounter (Signed)
 No seizure since April 2022

## 2023-08-22 NOTE — Telephone Encounter (Signed)
 Pt called no answer left a voice mail to call the office back when he calls back to to fine out when last seizure was?

## 2023-08-22 NOTE — Telephone Encounter (Signed)
 Pls confirm he has not had any seizures since last visit, last seizure was April 2022? Thanks

## 2023-08-22 NOTE — Telephone Encounter (Signed)
 Pt.s father called back please call him back, ok to leave detail with question that need to be answer

## 2023-08-26 DIAGNOSIS — Z0279 Encounter for issue of other medical certificate: Secondary | ICD-10-CM

## 2023-08-26 NOTE — Telephone Encounter (Signed)
 Pt called DMV paperwork faxed and copy mailed

## 2023-08-26 NOTE — Telephone Encounter (Signed)
 Done, thanks

## 2024-01-15 ENCOUNTER — Encounter: Payer: Self-pay | Admitting: Neurology

## 2024-04-24 ENCOUNTER — Ambulatory Visit: Payer: Federal, State, Local not specified - PPO | Admitting: Neurology
